# Patient Record
Sex: Male | Born: 1955
Health system: Southern US, Community
[De-identification: ages and names within clinical notes are randomized; demographics above are authoritative.]

## PROBLEM LIST (undated history)

## (undated) DIAGNOSIS — M545 Low back pain, unspecified: Secondary | ICD-10-CM

## (undated) DIAGNOSIS — J948 Other specified pleural conditions: Secondary | ICD-10-CM

## (undated) DIAGNOSIS — S2242XA Multiple fractures of ribs, left side, initial encounter for closed fracture: Secondary | ICD-10-CM

## (undated) DIAGNOSIS — B1920 Unspecified viral hepatitis C without hepatic coma: Secondary | ICD-10-CM

## (undated) DIAGNOSIS — E782 Mixed hyperlipidemia: Secondary | ICD-10-CM

## (undated) DIAGNOSIS — E039 Hypothyroidism, unspecified: Secondary | ICD-10-CM

## (undated) DIAGNOSIS — I10 Essential (primary) hypertension: Secondary | ICD-10-CM

## (undated) HISTORY — DX: Unspecified viral hepatitis C without hepatic coma: B19.20

## (undated) HISTORY — DX: Low back pain, unspecified: M54.50

## (undated) HISTORY — DX: Essential (primary) hypertension: I10

## (undated) HISTORY — DX: Other specified pleural conditions: J94.8

## (undated) HISTORY — DX: Hypothyroidism, unspecified: E03.9

## (undated) HISTORY — DX: Low back pain: M54.5

## (undated) HISTORY — PX: INGUINAL HERNIA REPAIR: SUR1180

## (undated) HISTORY — DX: Mixed hyperlipidemia: E78.2

## (undated) HISTORY — DX: Multiple fractures of ribs, left side, initial encounter for closed fracture: S22.42XA

---

## 2009-01-27 HISTORY — PX: LOBECTOMY: SHX5089

## 2009-01-27 HISTORY — PX: THORACOTOMY: SUR1349

## 2009-11-23 ENCOUNTER — Inpatient Hospital Stay (HOSPITAL_COMMUNITY)
Admission: AC | Admit: 2009-11-23 | Discharge: 2009-12-11 | Payer: Self-pay | Source: Home / Self Care | Admitting: Emergency Medicine

## 2009-11-24 DIAGNOSIS — J948 Other specified pleural conditions: Secondary | ICD-10-CM

## 2009-11-24 DIAGNOSIS — S2242XA Multiple fractures of ribs, left side, initial encounter for closed fracture: Secondary | ICD-10-CM

## 2009-11-24 HISTORY — PX: FRACTURE SURGERY: SHX138

## 2009-11-24 HISTORY — DX: Multiple fractures of ribs, left side, initial encounter for closed fracture: S22.42XA

## 2009-11-24 HISTORY — DX: Other specified pleural conditions: J94.8

## 2009-11-26 ENCOUNTER — Ambulatory Visit: Payer: Self-pay | Admitting: Thoracic Surgery

## 2009-11-28 ENCOUNTER — Ambulatory Visit: Payer: Self-pay | Admitting: Dentistry

## 2009-12-12 ENCOUNTER — Encounter: Payer: Self-pay | Admitting: Emergency Medicine

## 2009-12-13 ENCOUNTER — Observation Stay (HOSPITAL_COMMUNITY): Admission: EM | Admit: 2009-12-13 | Discharge: 2009-12-13 | Payer: Self-pay | Admitting: Emergency Medicine

## 2009-12-16 ENCOUNTER — Emergency Department (HOSPITAL_COMMUNITY): Admission: EM | Admit: 2009-12-16 | Discharge: 2009-12-17 | Payer: Self-pay | Admitting: Emergency Medicine

## 2009-12-21 ENCOUNTER — Emergency Department (HOSPITAL_COMMUNITY): Admission: EM | Admit: 2009-12-21 | Discharge: 2009-12-21 | Payer: Self-pay | Admitting: Emergency Medicine

## 2009-12-26 ENCOUNTER — Ambulatory Visit: Payer: Self-pay | Admitting: Thoracic Surgery

## 2009-12-26 ENCOUNTER — Encounter: Admission: RE | Admit: 2009-12-26 | Discharge: 2009-12-26 | Payer: Self-pay | Admitting: Thoracic Surgery

## 2010-02-06 ENCOUNTER — Ambulatory Visit
Admission: RE | Admit: 2010-02-06 | Discharge: 2010-02-06 | Payer: Self-pay | Source: Home / Self Care | Attending: Thoracic Surgery | Admitting: Thoracic Surgery

## 2010-02-06 ENCOUNTER — Encounter
Admission: RE | Admit: 2010-02-06 | Discharge: 2010-02-06 | Payer: Self-pay | Source: Home / Self Care | Attending: Thoracic Surgery | Admitting: Thoracic Surgery
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
## 2010-04-01 ENCOUNTER — Other Ambulatory Visit: Payer: Self-pay | Admitting: Thoracic Surgery

## 2010-04-01 DIAGNOSIS — S2249XA Multiple fractures of ribs, unspecified side, initial encounter for closed fracture: Secondary | ICD-10-CM

## 2010-04-02 ENCOUNTER — Ambulatory Visit
Admission: RE | Admit: 2010-04-02 | Discharge: 2010-04-02 | Disposition: A | Discharge status: Home / Self Care | Payer: Self-pay | Source: Ambulatory Visit | Attending: Thoracic Surgery | Admitting: Thoracic Surgery

## 2010-04-02 ENCOUNTER — Ambulatory Visit (INDEPENDENT_AMBULATORY_CARE_PROVIDER_SITE_OTHER): Payer: Self-pay | Admitting: Thoracic Surgery

## 2010-04-02 DIAGNOSIS — S2249XA Multiple fractures of ribs, unspecified side, initial encounter for closed fracture: Secondary | ICD-10-CM
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
## 2010-04-09 LAB — COMPREHENSIVE METABOLIC PANEL WITH GFR
ALT: 22 U/L (ref 0–53)
AST: 27 U/L (ref 0–37)
Albumin: 2.1 g/dL — ABNORMAL LOW (ref 3.5–5.2)
Alkaline Phosphatase: 36 U/L — ABNORMAL LOW (ref 39–117)
BUN: 17 mg/dL (ref 6–23)
CO2: 26 meq/L (ref 19–32)
Calcium: 7.6 mg/dL — ABNORMAL LOW (ref 8.4–10.5)
Chloride: 103 meq/L (ref 96–112)
Creatinine, Ser: 0.87 mg/dL (ref 0.4–1.5)
GFR calc non Af Amer: >60 mL/min
Glucose, Bld: 107 mg/dL — ABNORMAL HIGH (ref 70–99)
Potassium: 3.9 meq/L (ref 3.5–5.1)
Sodium: 132 meq/L — ABNORMAL LOW (ref 135–145)
Total Bilirubin: 0.9 mg/dL (ref 0.3–1.2)
Total Protein: 4.9 g/dL — ABNORMAL LOW (ref 6.0–8.3)

## 2010-04-09 LAB — CROSSMATCH: Unit division: 0

## 2010-04-09 LAB — BASIC METABOLIC PANEL
BUN: 11 mg/dL (ref 6–23)
BUN: 12 mg/dL (ref 6–23)
BUN: 13 mg/dL (ref 6–23)
BUN: 15 mg/dL (ref 6–23)
BUN: 16 mg/dL (ref 6–23)
BUN: 17 mg/dL (ref 6–23)
CO2: 25 mEq/L (ref 19–32)
CO2: 26 mEq/L (ref 19–32)
CO2: 26 mEq/L (ref 19–32)
CO2: 28 mEq/L (ref 19–32)
CO2: 29 mEq/L (ref 19–32)
Calcium: 8.4 mg/dL (ref 8.4–10.5)
Calcium: 9.2 mg/dL (ref 8.4–10.5)
Chloride: 104 mEq/L (ref 96–112)
Chloride: 105 mEq/L (ref 96–112)
Chloride: 106 mEq/L (ref 96–112)
Chloride: 108 mEq/L (ref 96–112)
Creatinine, Ser: 0.75 mg/dL (ref 0.4–1.5)
Creatinine, Ser: 0.77 mg/dL (ref 0.4–1.5)
Creatinine, Ser: 0.8 mg/dL (ref 0.4–1.5)
Creatinine, Ser: 0.84 mg/dL (ref 0.4–1.5)
Creatinine, Ser: 0.95 mg/dL (ref 0.4–1.5)
Creatinine, Ser: 0.97 mg/dL (ref 0.4–1.5)
Creatinine, Ser: 1.1 mg/dL (ref 0.4–1.5)
GFR calc Af Amer: >60 mL/min (ref >60)
GFR calc Af Amer: >60 mL/min (ref >60)
GFR calc Af Amer: >60 mL/min (ref >60)
GFR calc non Af Amer: >60 mL/min (ref >60)
GFR calc non Af Amer: >60 mL/min (ref >60)
GFR calc non Af Amer: >60 mL/min (ref >60)
GFR calc non Af Amer: >60 mL/min (ref >60)
Glucose, Bld: 100 mg/dL — ABNORMAL HIGH (ref 70–99)
Glucose, Bld: 108 mg/dL — ABNORMAL HIGH (ref 70–99)
Glucose, Bld: 109 mg/dL — ABNORMAL HIGH (ref 70–99)
Glucose, Bld: 112 mg/dL — ABNORMAL HIGH (ref 70–99)
Glucose, Bld: 86 mg/dL (ref 70–99)
Potassium: 4.5 mEq/L (ref 3.5–5.1)
Sodium: 137 mEq/L (ref 135–145)
Sodium: 137 mEq/L (ref 135–145)

## 2010-04-09 LAB — COMPREHENSIVE METABOLIC PANEL
ALT: 40 U/L (ref 0–53)
AST: 44 U/L — ABNORMAL HIGH (ref 0–37)
Albumin: 2.3 g/dL — ABNORMAL LOW (ref 3.5–5.2)
Alkaline Phosphatase: 131 U/L — ABNORMAL HIGH (ref 39–117)
BUN: 10 mg/dL (ref 6–23)
CO2: 24 mEq/L (ref 19–32)
CO2: 25 mEq/L (ref 19–32)
Calcium: 8 mg/dL — ABNORMAL LOW (ref 8.4–10.5)
Chloride: 110 mEq/L (ref 96–112)
Creatinine, Ser: 0.82 mg/dL (ref 0.4–1.5)
GFR calc Af Amer: >60 mL/min (ref >60)
GFR calc non Af Amer: >60 mL/min (ref >60)
GFR calc non Af Amer: >60 mL/min (ref >60)
Glucose, Bld: 119 mg/dL — ABNORMAL HIGH (ref 70–99)
Potassium: 3.9 mEq/L (ref 3.5–5.1)
Sodium: 137 mEq/L (ref 135–145)
Total Bilirubin: 0.7 mg/dL (ref 0.3–1.2)

## 2010-04-09 LAB — CBC
HCT: 24.6 % — ABNORMAL LOW (ref 39.0–52.0)
HCT: 27 % — ABNORMAL LOW (ref 39.0–52.0)
HCT: 27.9 % — ABNORMAL LOW (ref 39.0–52.0)
HCT: 29.3 % — ABNORMAL LOW (ref 39.0–52.0)
HCT: 31 % — ABNORMAL LOW (ref 39.0–52.0)
HCT: 32.2 % — ABNORMAL LOW (ref 39.0–52.0)
Hemoglobin: 10.1 g/dL — ABNORMAL LOW (ref 13.0–17.0)
Hemoglobin: 10.6 g/dL — ABNORMAL LOW (ref 13.0–17.0)
Hemoglobin: 10.8 g/dL — ABNORMAL LOW (ref 13.0–17.0)
Hemoglobin: 8.5 g/dL — ABNORMAL LOW (ref 13.0–17.0)
Hemoglobin: 8.6 g/dL — ABNORMAL LOW (ref 13.0–17.0)
Hemoglobin: 9.3 g/dL — ABNORMAL LOW (ref 13.0–17.0)
MCH: 29.5 pg (ref 26.0–34.0)
MCH: 29.8 pg (ref 26.0–34.0)
MCH: 30.2 pg (ref 26.0–34.0)
MCH: 30.4 pg (ref 26.0–34.0)
MCH: 30.5 pg (ref 26.0–34.0)
MCH: 30.5 pg (ref 26.0–34.0)
MCH: 30.6 pg (ref 26.0–34.0)
MCHC: 33.3 g/dL (ref 30.0–36.0)
MCHC: 33.7 g/dL (ref 30.0–36.0)
MCHC: 34.1 g/dL (ref 30.0–36.0)
MCHC: 34.2 g/dL (ref 30.0–36.0)
MCHC: 34.5 g/dL (ref 30.0–36.0)
MCHC: 34.5 g/dL (ref 30.0–36.0)
MCHC: 34.7 g/dL (ref 30.0–36.0)
MCHC: 35 g/dL (ref 30.0–36.0)
MCV: 86.4 fL (ref 78.0–100.0)
MCV: 87.4 fL (ref 78.0–100.0)
MCV: 87.4 fL (ref 78.0–100.0)
MCV: 87.7 fL (ref 78.0–100.0)
MCV: 88.6 fL (ref 78.0–100.0)
MCV: 89.1 fL (ref 78.0–100.0)
MCV: 89.3 fL (ref 78.0–100.0)
MCV: 89.3 fL (ref 78.0–100.0)
Platelets: 185 10*3/uL (ref 150–400)
Platelets: 212 10*3/uL (ref 150–400)
Platelets: 218 10*3/uL (ref 150–400)
Platelets: 225 10*3/uL (ref 150–400)
Platelets: 229 10*3/uL (ref 150–400)
Platelets: 285 10*3/uL (ref 150–400)
Platelets: 353 10*3/uL (ref 150–400)
Platelets: 511 10*3/uL — ABNORMAL HIGH (ref 150–400)
RBC: 2.84 MIL/uL — ABNORMAL LOW (ref 4.22–5.81)
RBC: 3.15 MIL/uL — ABNORMAL LOW (ref 4.22–5.81)
RBC: 3.34 MIL/uL — ABNORMAL LOW (ref 4.22–5.81)
RBC: 3.48 MIL/uL — ABNORMAL LOW (ref 4.22–5.81)
RBC: 3.49 MIL/uL — ABNORMAL LOW (ref 4.22–5.81)
RBC: 3.55 MIL/uL — ABNORMAL LOW (ref 4.22–5.81)
RDW: 12.8 % (ref 11.5–15.5)
RDW: 12.8 % (ref 11.5–15.5)
RDW: 12.8 % (ref 11.5–15.5)
RDW: 12.8 % (ref 11.5–15.5)
RDW: 12.8 % (ref 11.5–15.5)
RDW: 13.3 % (ref 11.5–15.5)
RDW: 14.1 % (ref 11.5–15.5)
WBC: 6.2 10*3/uL (ref 4.0–10.5)
WBC: 6.7 10*3/uL (ref 4.0–10.5)
WBC: 6.9 10*3/uL (ref 4.0–10.5)
WBC: 7.4 10*3/uL (ref 4.0–10.5)
WBC: 7.8 10*3/uL (ref 4.0–10.5)
WBC: 9 10*3/uL (ref 4.0–10.5)
WBC: 8.9 10*3/uL (ref 4.0–10.5)

## 2010-04-09 LAB — URINE CULTURE
Culture  Setup Time: 201111051823
Culture: NO GROWTH

## 2010-04-09 LAB — RPR: RPR Ser Ql: NONREACTIVE

## 2010-04-09 LAB — BASIC METABOLIC PANEL WITH GFR
BUN: 12 mg/dL (ref 6–23)
CO2: 30 meq/L (ref 19–32)
Calcium: 8 mg/dL — ABNORMAL LOW (ref 8.4–10.5)
Chloride: 101 meq/L (ref 96–112)
Creatinine, Ser: 0.84 mg/dL (ref 0.4–1.5)
GFR calc non Af Amer: >60 mL/min
Glucose, Bld: 130 mg/dL — ABNORMAL HIGH (ref 70–99)
Potassium: 4.3 meq/L (ref 3.5–5.1)
Sodium: 135 meq/L (ref 135–145)

## 2010-04-09 LAB — URINALYSIS, ROUTINE W REFLEX MICROSCOPIC
Bilirubin Urine: NEGATIVE
Glucose, UA: NEGATIVE mg/dL
Glucose, UA: NEGATIVE mg/dL
Hgb urine dipstick: NEGATIVE
Ketones, ur: 15 mg/dL — AB
Ketones, ur: NEGATIVE mg/dL
Nitrite: NEGATIVE
Protein, ur: 30 mg/dL — AB
Protein, ur: NEGATIVE mg/dL
Specific Gravity, Urine: 1.015 (ref 1.005–1.030)
Urobilinogen, UA: 0.2 mg/dL (ref 0.0–1.0)
Urobilinogen, UA: 2 mg/dL — ABNORMAL HIGH (ref 0.0–1.0)
pH: 6 (ref 5.0–8.0)

## 2010-04-09 LAB — DIFFERENTIAL
Basophils Absolute: 0.1 10*3/uL (ref 0.0–0.1)
Basophils Relative: 1 % (ref 0–1)
Basophils Relative: 1 % (ref 0–1)
Eosinophils Absolute: 0.5 10*3/uL (ref 0.0–0.7)
Eosinophils Relative: 8 % — ABNORMAL HIGH (ref 0–5)
Lymphocytes Relative: 22 % (ref 12–46)
Lymphs Abs: 1.2 10*3/uL (ref 0.7–4.0)
Monocytes Absolute: 0.7 10*3/uL (ref 0.1–1.0)
Monocytes Relative: 8 % (ref 3–12)
Neutro Abs: 5.8 10*3/uL (ref 1.7–7.7)
Neutrophils Relative %: 66 % (ref 43–77)

## 2010-04-09 LAB — BLOOD GAS, ARTERIAL
Bicarbonate: 27.8 mEq/L — ABNORMAL HIGH (ref 20.0–24.0)
O2 Saturation: 96 %
Patient temperature: 98.6
TCO2: 28.9 mmol/L (ref 0–100)
pH, Arterial: 7.476 — ABNORMAL HIGH (ref 7.350–7.450)

## 2010-04-09 LAB — URINE MICROSCOPIC-ADD ON

## 2010-04-09 LAB — POCT CARDIAC MARKERS: Troponin i, poc: <0.05 ng/mL (ref 0.00–0.09)

## 2010-04-09 LAB — RAPID URINE DRUG SCREEN, HOSP PERFORMED
Amphetamines: NOT DETECTED
Barbiturates: NOT DETECTED
Barbiturates: NOT DETECTED
Benzodiazepines: POSITIVE — AB
Benzodiazepines: POSITIVE — AB
Cocaine: NOT DETECTED
Cocaine: NOT DETECTED
Opiates: POSITIVE — AB
Tetrahydrocannabinol: NOT DETECTED

## 2010-04-09 LAB — TSH: TSH: 3.991 u[IU]/mL (ref 0.350–4.500)

## 2010-04-09 LAB — PROTIME-INR
INR: 1.02 (ref 0.00–1.49)
Prothrombin Time: 13.6 seconds (ref 11.6–15.2)

## 2010-04-10 LAB — CBC
HCT: 34.1 % — ABNORMAL LOW (ref 39.0–52.0)
HCT: 36.3 % — ABNORMAL LOW (ref 39.0–52.0)
HCT: 39.4 % (ref 39.0–52.0)
Hemoglobin: 10 g/dL — ABNORMAL LOW (ref 13.0–17.0)
Hemoglobin: 13.5 g/dL (ref 13.0–17.0)
MCH: 29.8 pg (ref 26.0–34.0)
MCH: 29.9 pg (ref 26.0–34.0)
MCH: 30.1 pg (ref 26.0–34.0)
MCHC: 33.4 g/dL (ref 30.0–36.0)
MCHC: 33.7 g/dL (ref 30.0–36.0)
MCHC: 34.3 g/dL (ref 30.0–36.0)
MCV: 88.3 fL (ref 78.0–100.0)
Platelets: 151 10*3/uL (ref 150–400)
Platelets: 189 10*3/uL (ref 150–400)
RBC: 4.55 MIL/uL (ref 4.22–5.81)
RDW: 12.5 % (ref 11.5–15.5)
RDW: 12.5 % (ref 11.5–15.5)
RDW: 12.9 % (ref 11.5–15.5)
WBC: 13.2 10*3/uL — ABNORMAL HIGH (ref 4.0–10.5)

## 2010-04-10 LAB — TYPE AND SCREEN

## 2010-04-10 LAB — COMPREHENSIVE METABOLIC PANEL
ALT: 69 U/L — ABNORMAL HIGH (ref 0–53)
Alkaline Phosphatase: 65 U/L (ref 39–117)
CO2: 21 mEq/L (ref 19–32)
Chloride: 107 mEq/L (ref 96–112)
GFR calc non Af Amer: 58 mL/min — ABNORMAL LOW (ref >60)
Glucose, Bld: 99 mg/dL (ref 70–99)
Potassium: 4 mEq/L (ref 3.5–5.1)
Sodium: 136 mEq/L (ref 135–145)
Total Bilirubin: 1 mg/dL (ref 0.3–1.2)

## 2010-04-10 LAB — BASIC METABOLIC PANEL
BUN: 12 mg/dL (ref 6–23)
BUN: 29 mg/dL — ABNORMAL HIGH (ref 6–23)
CO2: 23 mEq/L (ref 19–32)
CO2: 24 mEq/L (ref 19–32)
CO2: 28 mEq/L (ref 19–32)
Calcium: 8.3 mg/dL — ABNORMAL LOW (ref 8.4–10.5)
Calcium: 8.7 mg/dL (ref 8.4–10.5)
Chloride: 107 mEq/L (ref 96–112)
Creatinine, Ser: 0.78 mg/dL (ref 0.4–1.5)
Creatinine, Ser: 0.83 mg/dL (ref 0.4–1.5)
Creatinine, Ser: 1.15 mg/dL (ref 0.4–1.5)
GFR calc non Af Amer: >60 mL/min (ref >60)
GFR calc non Af Amer: >60 mL/min (ref >60)
Glucose, Bld: 104 mg/dL — ABNORMAL HIGH (ref 70–99)
Glucose, Bld: 113 mg/dL — ABNORMAL HIGH (ref 70–99)
Potassium: 4.7 mEq/L (ref 3.5–5.1)
Sodium: 137 mEq/L (ref 135–145)
Sodium: 139 mEq/L (ref 135–145)

## 2010-04-10 LAB — PROTIME-INR: Prothrombin Time: 14.1 seconds (ref 11.6–15.2)

## 2010-04-10 LAB — POCT I-STAT, CHEM 8
Chloride: 109 mEq/L (ref 96–112)
Glucose, Bld: 100 mg/dL — ABNORMAL HIGH (ref 70–99)
HCT: 43 % (ref 39.0–52.0)
Potassium: 4.2 mEq/L (ref 3.5–5.1)

## 2010-05-23 DIAGNOSIS — Z0271 Encounter for disability determination: Secondary | ICD-10-CM

## 2010-05-31 ENCOUNTER — Other Ambulatory Visit: Payer: Self-pay | Admitting: Thoracic Surgery

## 2010-06-04 ENCOUNTER — Ambulatory Visit
Admission: RE | Admit: 2010-06-04 | Discharge: 2010-06-04 | Disposition: A | Discharge status: Home / Self Care | Payer: Self-pay | Source: Ambulatory Visit | Attending: Thoracic Surgery | Admitting: Thoracic Surgery

## 2010-06-04 ENCOUNTER — Ambulatory Visit (INDEPENDENT_AMBULATORY_CARE_PROVIDER_SITE_OTHER): Payer: Self-pay | Admitting: Thoracic Surgery

## 2010-06-04 DIAGNOSIS — S2249XA Multiple fractures of ribs, unspecified side, initial encounter for closed fracture: Secondary | ICD-10-CM

## 2010-06-05 NOTE — Assessment & Plan Note (Signed)
OFFICE VISIT  Colton Gibbs, Colton Gibbs DOB:  1955-12-18                                        Jun 04, 2010 CHART #:  [id number]  The patient is referred from [facility] Room with questionable loose plate.  We got his chest x-ray here and compared it to the areas in the second plate of the fourth rib does look like it is a little loose laterally, but I think it is still stable.  The rib has healed completely and he has lost 2 screws into the soft tissues and I explained that to him.  His main pain is left shoulder is probably related and it is not related to subscapular pain, but is related more to rotator cuff and probably biceps tendon.  His blood pressure was 118/70, pulse 78, respirations 20, and sats were 99%.  Ines Bloomer, M.D. Electronically Signed  DPB/MEDQ  D:  06/04/2010  T:  06/05/2010  Job:  [id number]

## 2010-06-11 NOTE — Assessment & Plan Note (Signed)
OFFICE VISIT   Colton Gibbs, Colton Gibbs  DOB:  13-Nov-1955                                        February 06, 2010  CHART #:  [id number]   The patient returns after his rib plating.  He still has some mild  tenderness in his left chest.  We gave him a refill for Vicodin 5/500,  #60 with no refills.  His chest x-ray looks like rib fractures are  healing well.  I plan to see him back again in 8 weeks with another  chest x-ray for final check.  His blood pressure was 124/87, pulse 69,  respirations 18, saturations were 96%.   Ines Bloomer, M.D.  Electronically Signed   DPB/MEDQ  D:  02/06/2010  T:  02/06/2010  Job:  [id number]
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
## 2010-11-29 ENCOUNTER — Other Ambulatory Visit: Payer: Self-pay | Admitting: Thoracic Surgery

## 2010-11-29 DIAGNOSIS — S2249XA Multiple fractures of ribs, unspecified side, initial encounter for closed fracture: Secondary | ICD-10-CM

## 2010-12-03 ENCOUNTER — Encounter: Payer: Self-pay | Admitting: *Deleted

## 2010-12-03 DIAGNOSIS — J948 Other specified pleural conditions: Secondary | ICD-10-CM | POA: Insufficient documentation

## 2010-12-03 DIAGNOSIS — S2242XA Multiple fractures of ribs, left side, initial encounter for closed fracture: Secondary | ICD-10-CM | POA: Insufficient documentation

## 2010-12-04 ENCOUNTER — Encounter: Payer: Self-pay | Admitting: Thoracic Surgery

## 2010-12-04 ENCOUNTER — Ambulatory Visit
Admission: RE | Admit: 2010-12-04 | Discharge: 2010-12-04 | Disposition: A | Discharge status: Home / Self Care | Payer: Medicaid Other | Source: Ambulatory Visit | Attending: Thoracic Surgery | Admitting: Thoracic Surgery

## 2010-12-04 ENCOUNTER — Ambulatory Visit (INDEPENDENT_AMBULATORY_CARE_PROVIDER_SITE_OTHER): Payer: Medicaid Other | Admitting: Thoracic Surgery

## 2010-12-04 VITALS — BP 103/70 | HR 100 | Resp 20 | Ht 77.0 in | Wt 215.0 lb

## 2010-12-04 DIAGNOSIS — S2249XA Multiple fractures of ribs, unspecified side, initial encounter for closed fracture: Secondary | ICD-10-CM
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
## 2011-02-19 DIAGNOSIS — Z0271 Encounter for disability determination: Secondary | ICD-10-CM

## 2011-05-30 ENCOUNTER — Other Ambulatory Visit: Payer: Self-pay | Admitting: Thoracic Surgery

## 2011-05-30 DIAGNOSIS — S2249XA Multiple fractures of ribs, unspecified side, initial encounter for closed fracture: Secondary | ICD-10-CM

## 2011-06-04 ENCOUNTER — Ambulatory Visit: Payer: Medicaid Other | Admitting: Thoracic Surgery

## 2011-06-04 ENCOUNTER — Ambulatory Visit
Admission: RE | Admit: 2011-06-04 | Discharge: 2011-06-04 | Disposition: A | Discharge status: Home / Self Care | Payer: Medicaid Other | Source: Ambulatory Visit | Attending: Thoracic Surgery | Admitting: Thoracic Surgery

## 2011-06-04 ENCOUNTER — Ambulatory Visit (INDEPENDENT_AMBULATORY_CARE_PROVIDER_SITE_OTHER): Payer: Medicaid Other | Admitting: Thoracic Surgery

## 2011-06-04 VITALS — Ht 77.0 in | Wt 215.0 lb

## 2011-06-04 DIAGNOSIS — S2249XA Multiple fractures of ribs, unspecified side, initial encounter for closed fracture: Secondary | ICD-10-CM

## 2011-06-04 DIAGNOSIS — Z9889 Other specified postprocedural states: Secondary | ICD-10-CM

## 2011-06-05 NOTE — Progress Notes (Signed)
Patient ID: Colton Gibbs, male   DOB: 02-25-1955, 56 y.o.   MRN: [id number] The patient had a chest x-ray which was stable but did not return for followup. We will try to reschedule.

## 2011-07-16 ENCOUNTER — Encounter: Payer: Self-pay | Admitting: Thoracic Surgery

## 2011-07-16 ENCOUNTER — Ambulatory Visit (INDEPENDENT_AMBULATORY_CARE_PROVIDER_SITE_OTHER): Payer: Medicaid Other | Admitting: Thoracic Surgery

## 2011-07-16 VITALS — BP 112/70 | HR 83 | Resp 18 | Ht 77.0 in | Wt 215.0 lb

## 2011-07-16 DIAGNOSIS — S2249XA Multiple fractures of ribs, unspecified side, initial encounter for closed fracture: Secondary | ICD-10-CM

## 2011-07-16 DIAGNOSIS — Z09 Encounter for follow-up examination after completed treatment for conditions other than malignant neoplasm: Secondary | ICD-10-CM
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
## 2011-08-08 DIAGNOSIS — Z0279 Encounter for issue of other medical certificate: Secondary | ICD-10-CM
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
## 2011-11-20 IMAGING — CR DG CHEST 1V PORT
1 series · 1 of 1 positions shown · non-contrast
Comparison: Yesterday

CLINICAL DATA: Pneumothorax

PORTABLE CHEST - 1 VIEW

[view not recorded]
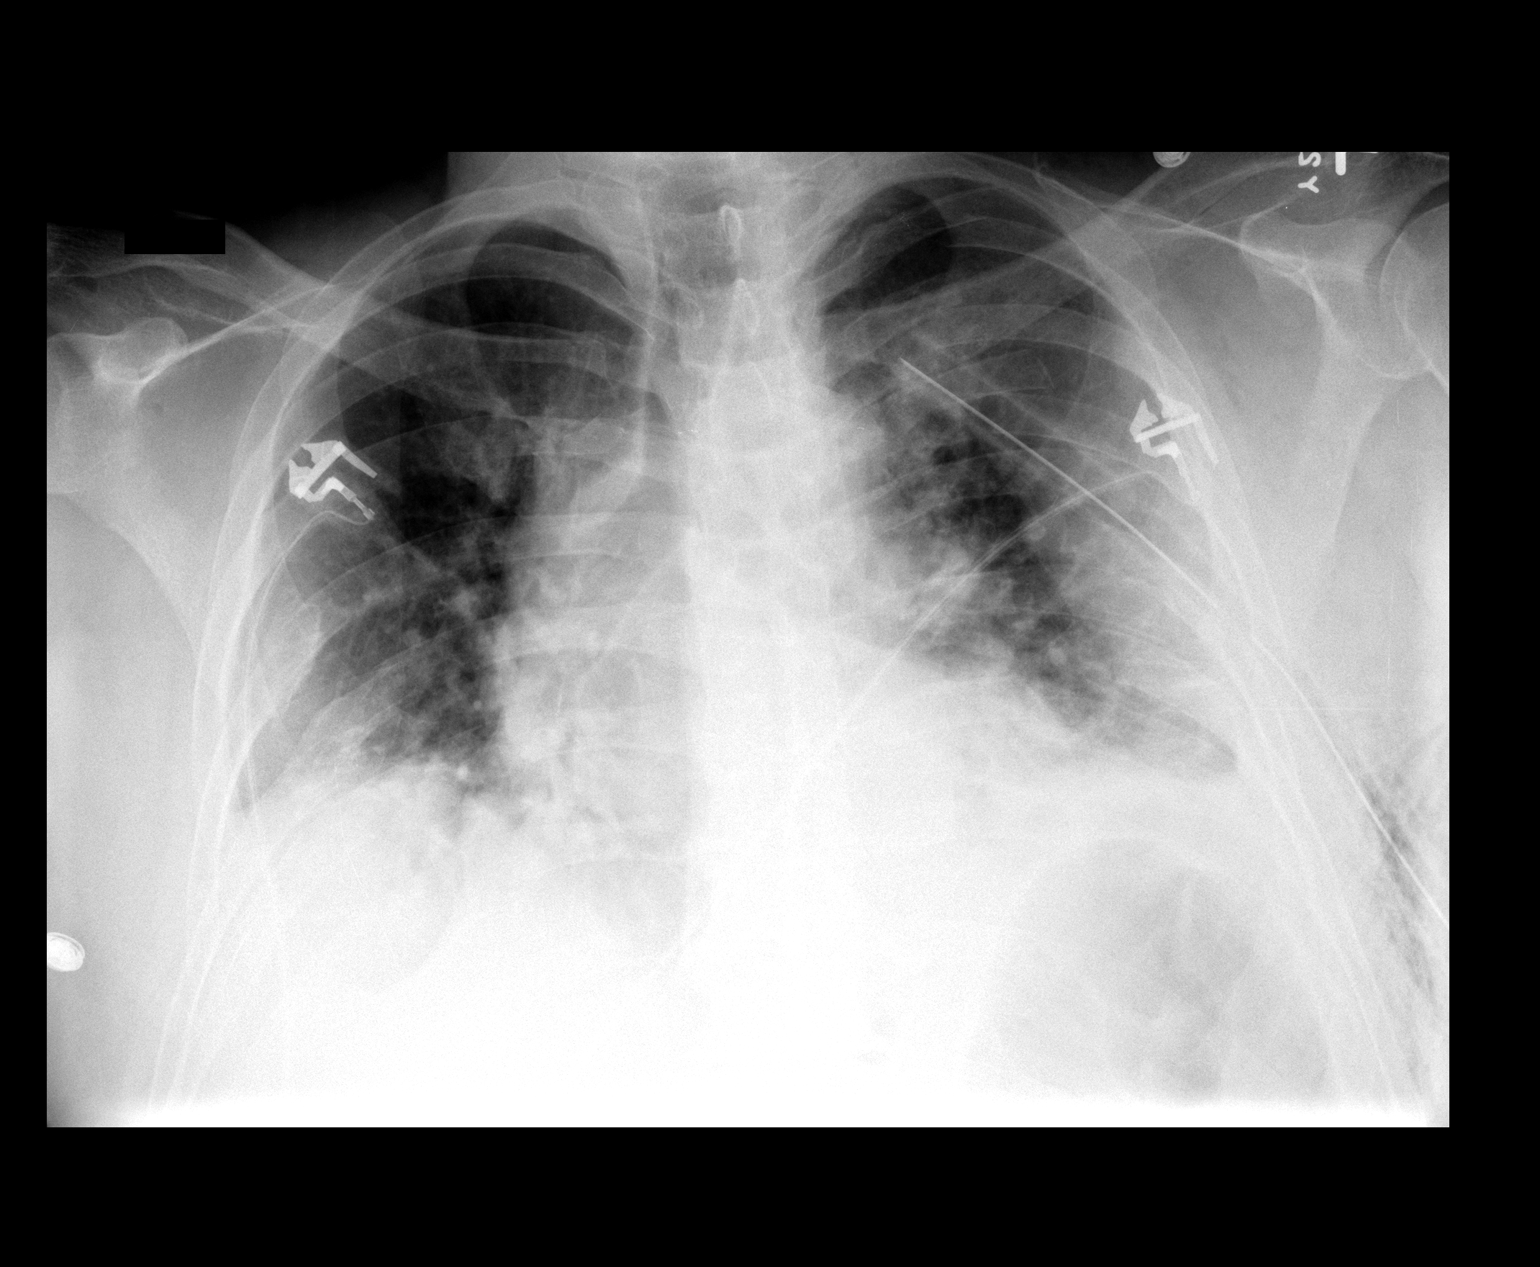

[1 of 1 positions shown; findings below may reference images not displayed]

FINDINGS: Left chest tube remains in place.  No pneumothorax.  Very
low volumes.  Central basilar atelectasis.  Heart is prominent
secondary low volumes.  Left pleural effusion is suspect.  Left rib
fractures are redemonstrated with deformity of thoracic cage.
IMPRESSION: Worsening basilar atelectasis.

Stable chest tube without pneumothorax.
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
## 2013-01-27 HISTORY — PX: COLONOSCOPY: SHX174

## 2014-09-06 ENCOUNTER — Other Ambulatory Visit (HOSPITAL_COMMUNITY): Payer: Self-pay | Admitting: Internal Medicine

## 2014-09-06 DIAGNOSIS — B1920 Unspecified viral hepatitis C without hepatic coma: Secondary | ICD-10-CM

## 2014-09-13 ENCOUNTER — Other Ambulatory Visit (HOSPITAL_COMMUNITY): Payer: Medicaid Other

## 2014-09-14 ENCOUNTER — Ambulatory Visit (HOSPITAL_COMMUNITY)
Admission: RE | Admit: 2014-09-14 | Discharge: 2014-09-14 | Disposition: A | Discharge status: Home / Self Care | Payer: Medicare Other | Source: Ambulatory Visit | Attending: Internal Medicine | Admitting: Internal Medicine

## 2014-09-14 DIAGNOSIS — B1920 Unspecified viral hepatitis C without hepatic coma: Secondary | ICD-10-CM

## 2014-12-01 DIAGNOSIS — J200 Acute bronchitis due to Mycoplasma pneumoniae: Secondary | ICD-10-CM | POA: Diagnosis not present

## 2014-12-27 DIAGNOSIS — B1920 Unspecified viral hepatitis C without hepatic coma: Secondary | ICD-10-CM | POA: Diagnosis not present

## 2015-01-03 DIAGNOSIS — M791 Myalgia: Secondary | ICD-10-CM | POA: Diagnosis not present

## 2015-01-03 DIAGNOSIS — M7502 Adhesive capsulitis of left shoulder: Secondary | ICD-10-CM | POA: Diagnosis not present

## 2015-01-03 DIAGNOSIS — R0789 Other chest pain: Secondary | ICD-10-CM | POA: Diagnosis not present

## 2015-01-03 DIAGNOSIS — M47816 Spondylosis without myelopathy or radiculopathy, lumbar region: Secondary | ICD-10-CM | POA: Diagnosis not present

## 2015-01-24 DIAGNOSIS — B1920 Unspecified viral hepatitis C without hepatic coma: Secondary | ICD-10-CM | POA: Diagnosis not present

## 2015-02-06 DIAGNOSIS — B1920 Unspecified viral hepatitis C without hepatic coma: Secondary | ICD-10-CM | POA: Diagnosis not present

## 2015-02-13 DIAGNOSIS — B1920 Unspecified viral hepatitis C without hepatic coma: Secondary | ICD-10-CM | POA: Diagnosis not present

## 2015-03-05 DIAGNOSIS — B182 Chronic viral hepatitis C: Secondary | ICD-10-CM | POA: Diagnosis not present

## 2015-03-05 DIAGNOSIS — B1920 Unspecified viral hepatitis C without hepatic coma: Secondary | ICD-10-CM | POA: Diagnosis not present

## 2015-03-28 DIAGNOSIS — B1920 Unspecified viral hepatitis C without hepatic coma: Secondary | ICD-10-CM | POA: Diagnosis not present

## 2015-04-10 DIAGNOSIS — E782 Mixed hyperlipidemia: Secondary | ICD-10-CM | POA: Diagnosis not present

## 2015-04-10 DIAGNOSIS — E039 Hypothyroidism, unspecified: Secondary | ICD-10-CM | POA: Diagnosis not present

## 2015-04-10 DIAGNOSIS — D649 Anemia, unspecified: Secondary | ICD-10-CM | POA: Diagnosis not present

## 2015-04-10 DIAGNOSIS — R5383 Other fatigue: Secondary | ICD-10-CM | POA: Diagnosis not present

## 2015-04-10 DIAGNOSIS — I10 Essential (primary) hypertension: Secondary | ICD-10-CM | POA: Diagnosis not present

## 2015-04-13 DIAGNOSIS — Z1389 Encounter for screening for other disorder: Secondary | ICD-10-CM | POA: Diagnosis not present

## 2015-04-13 DIAGNOSIS — E782 Mixed hyperlipidemia: Secondary | ICD-10-CM | POA: Diagnosis not present

## 2015-04-13 DIAGNOSIS — Z0001 Encounter for general adult medical examination with abnormal findings: Secondary | ICD-10-CM | POA: Diagnosis not present

## 2015-04-13 DIAGNOSIS — E039 Hypothyroidism, unspecified: Secondary | ICD-10-CM | POA: Diagnosis not present

## 2015-04-13 DIAGNOSIS — I10 Essential (primary) hypertension: Secondary | ICD-10-CM | POA: Diagnosis not present

## 2015-04-13 DIAGNOSIS — Z9189 Other specified personal risk factors, not elsewhere classified: Secondary | ICD-10-CM | POA: Diagnosis not present

## 2015-05-04 DIAGNOSIS — M7502 Adhesive capsulitis of left shoulder: Secondary | ICD-10-CM | POA: Diagnosis not present

## 2015-05-04 DIAGNOSIS — M47816 Spondylosis without myelopathy or radiculopathy, lumbar region: Secondary | ICD-10-CM | POA: Diagnosis not present

## 2015-05-04 DIAGNOSIS — R0789 Other chest pain: Secondary | ICD-10-CM | POA: Diagnosis not present

## 2015-05-04 DIAGNOSIS — Z8781 Personal history of (healed) traumatic fracture: Secondary | ICD-10-CM | POA: Diagnosis not present

## 2015-05-25 DIAGNOSIS — E039 Hypothyroidism, unspecified: Secondary | ICD-10-CM | POA: Diagnosis not present

## 2015-07-10 DIAGNOSIS — E039 Hypothyroidism, unspecified: Secondary | ICD-10-CM | POA: Diagnosis not present

## 2015-08-07 DIAGNOSIS — E039 Hypothyroidism, unspecified: Secondary | ICD-10-CM | POA: Diagnosis not present

## 2015-08-07 DIAGNOSIS — D649 Anemia, unspecified: Secondary | ICD-10-CM | POA: Diagnosis not present

## 2015-08-07 DIAGNOSIS — E782 Mixed hyperlipidemia: Secondary | ICD-10-CM | POA: Diagnosis not present

## 2015-08-07 DIAGNOSIS — R5383 Other fatigue: Secondary | ICD-10-CM | POA: Diagnosis not present

## 2015-08-07 DIAGNOSIS — I10 Essential (primary) hypertension: Secondary | ICD-10-CM | POA: Diagnosis not present

## 2015-08-09 DIAGNOSIS — E039 Hypothyroidism, unspecified: Secondary | ICD-10-CM | POA: Diagnosis not present

## 2015-08-09 DIAGNOSIS — M545 Low back pain: Secondary | ICD-10-CM | POA: Diagnosis not present

## 2015-08-09 DIAGNOSIS — E782 Mixed hyperlipidemia: Secondary | ICD-10-CM | POA: Diagnosis not present

## 2015-08-09 DIAGNOSIS — I10 Essential (primary) hypertension: Secondary | ICD-10-CM | POA: Diagnosis not present

## 2015-08-09 DIAGNOSIS — Z1212 Encounter for screening for malignant neoplasm of rectum: Secondary | ICD-10-CM | POA: Diagnosis not present

## 2015-08-14 DIAGNOSIS — K4090 Unilateral inguinal hernia, without obstruction or gangrene, not specified as recurrent: Secondary | ICD-10-CM | POA: Diagnosis not present

## 2015-08-15 DIAGNOSIS — Z79899 Other long term (current) drug therapy: Secondary | ICD-10-CM | POA: Diagnosis not present

## 2015-08-15 DIAGNOSIS — D176 Benign lipomatous neoplasm of spermatic cord: Secondary | ICD-10-CM | POA: Diagnosis not present

## 2015-08-15 DIAGNOSIS — E039 Hypothyroidism, unspecified: Secondary | ICD-10-CM | POA: Diagnosis not present

## 2015-08-15 DIAGNOSIS — I10 Essential (primary) hypertension: Secondary | ICD-10-CM | POA: Diagnosis not present

## 2015-08-15 DIAGNOSIS — E7800 Pure hypercholesterolemia, unspecified: Secondary | ICD-10-CM | POA: Diagnosis not present

## 2015-08-15 DIAGNOSIS — Z811 Family history of alcohol abuse and dependence: Secondary | ICD-10-CM | POA: Diagnosis not present

## 2015-08-15 DIAGNOSIS — D179 Benign lipomatous neoplasm, unspecified: Secondary | ICD-10-CM | POA: Diagnosis not present

## 2015-08-15 DIAGNOSIS — Z8619 Personal history of other infectious and parasitic diseases: Secondary | ICD-10-CM | POA: Diagnosis not present

## 2015-08-15 DIAGNOSIS — K4090 Unilateral inguinal hernia, without obstruction or gangrene, not specified as recurrent: Secondary | ICD-10-CM | POA: Diagnosis not present

## 2015-08-15 DIAGNOSIS — Z880 Allergy status to penicillin: Secondary | ICD-10-CM | POA: Diagnosis not present

## 2015-08-15 DIAGNOSIS — R339 Retention of urine, unspecified: Secondary | ICD-10-CM | POA: Diagnosis not present

## 2015-08-20 DIAGNOSIS — R339 Retention of urine, unspecified: Secondary | ICD-10-CM | POA: Diagnosis not present

## 2015-08-20 DIAGNOSIS — R3915 Urgency of urination: Secondary | ICD-10-CM | POA: Diagnosis not present

## 2015-08-20 DIAGNOSIS — R3912 Poor urinary stream: Secondary | ICD-10-CM | POA: Diagnosis not present

## 2015-09-13 DIAGNOSIS — B1920 Unspecified viral hepatitis C without hepatic coma: Secondary | ICD-10-CM | POA: Diagnosis not present

## 2015-09-24 DIAGNOSIS — G8929 Other chronic pain: Secondary | ICD-10-CM | POA: Diagnosis not present

## 2015-09-24 DIAGNOSIS — M47817 Spondylosis without myelopathy or radiculopathy, lumbosacral region: Secondary | ICD-10-CM | POA: Diagnosis not present

## 2015-09-24 DIAGNOSIS — Z79899 Other long term (current) drug therapy: Secondary | ICD-10-CM | POA: Diagnosis not present

## 2015-09-24 DIAGNOSIS — Z8781 Personal history of (healed) traumatic fracture: Secondary | ICD-10-CM | POA: Diagnosis not present

## 2015-09-24 DIAGNOSIS — R0789 Other chest pain: Secondary | ICD-10-CM | POA: Diagnosis not present

## 2015-09-25 DIAGNOSIS — R3912 Poor urinary stream: Secondary | ICD-10-CM | POA: Diagnosis not present

## 2015-09-25 DIAGNOSIS — R339 Retention of urine, unspecified: Secondary | ICD-10-CM | POA: Diagnosis not present

## 2015-09-25 DIAGNOSIS — R3915 Urgency of urination: Secondary | ICD-10-CM | POA: Diagnosis not present

## 2015-10-24 DIAGNOSIS — G8929 Other chronic pain: Secondary | ICD-10-CM | POA: Diagnosis not present

## 2015-10-24 DIAGNOSIS — Z79891 Long term (current) use of opiate analgesic: Secondary | ICD-10-CM | POA: Diagnosis not present

## 2015-10-24 DIAGNOSIS — M5136 Other intervertebral disc degeneration, lumbar region: Secondary | ICD-10-CM | POA: Diagnosis not present

## 2015-10-24 DIAGNOSIS — R0789 Other chest pain: Secondary | ICD-10-CM | POA: Diagnosis not present

## 2015-11-06 DIAGNOSIS — M5442 Lumbago with sciatica, left side: Secondary | ICD-10-CM | POA: Diagnosis not present

## 2015-11-06 DIAGNOSIS — G894 Chronic pain syndrome: Secondary | ICD-10-CM | POA: Diagnosis not present

## 2015-11-06 DIAGNOSIS — M5432 Sciatica, left side: Secondary | ICD-10-CM | POA: Diagnosis not present

## 2015-11-06 DIAGNOSIS — M545 Low back pain: Secondary | ICD-10-CM | POA: Diagnosis not present

## 2015-11-06 DIAGNOSIS — Z79891 Long term (current) use of opiate analgesic: Secondary | ICD-10-CM | POA: Diagnosis not present

## 2015-11-19 DIAGNOSIS — M545 Low back pain: Secondary | ICD-10-CM | POA: Diagnosis not present

## 2015-11-19 DIAGNOSIS — M79652 Pain in left thigh: Secondary | ICD-10-CM | POA: Diagnosis not present

## 2015-11-19 DIAGNOSIS — M25512 Pain in left shoulder: Secondary | ICD-10-CM | POA: Diagnosis not present

## 2015-11-19 DIAGNOSIS — G894 Chronic pain syndrome: Secondary | ICD-10-CM | POA: Diagnosis not present

## 2015-11-19 DIAGNOSIS — Z79891 Long term (current) use of opiate analgesic: Secondary | ICD-10-CM | POA: Diagnosis not present

## 2015-11-21 DIAGNOSIS — M9901 Segmental and somatic dysfunction of cervical region: Secondary | ICD-10-CM | POA: Diagnosis not present

## 2015-11-21 DIAGNOSIS — M9902 Segmental and somatic dysfunction of thoracic region: Secondary | ICD-10-CM | POA: Diagnosis not present

## 2015-11-21 DIAGNOSIS — S134XXA Sprain of ligaments of cervical spine, initial encounter: Secondary | ICD-10-CM | POA: Diagnosis not present

## 2015-11-21 DIAGNOSIS — M9903 Segmental and somatic dysfunction of lumbar region: Secondary | ICD-10-CM | POA: Diagnosis not present

## 2015-11-21 DIAGNOSIS — M47812 Spondylosis without myelopathy or radiculopathy, cervical region: Secondary | ICD-10-CM | POA: Diagnosis not present

## 2015-11-23 DIAGNOSIS — M9901 Segmental and somatic dysfunction of cervical region: Secondary | ICD-10-CM | POA: Diagnosis not present

## 2015-11-23 DIAGNOSIS — M47812 Spondylosis without myelopathy or radiculopathy, cervical region: Secondary | ICD-10-CM | POA: Diagnosis not present

## 2015-11-23 DIAGNOSIS — M9902 Segmental and somatic dysfunction of thoracic region: Secondary | ICD-10-CM | POA: Diagnosis not present

## 2015-11-23 DIAGNOSIS — M9903 Segmental and somatic dysfunction of lumbar region: Secondary | ICD-10-CM | POA: Diagnosis not present

## 2015-11-23 DIAGNOSIS — S134XXA Sprain of ligaments of cervical spine, initial encounter: Secondary | ICD-10-CM | POA: Diagnosis not present

## 2015-11-28 DIAGNOSIS — E782 Mixed hyperlipidemia: Secondary | ICD-10-CM | POA: Diagnosis not present

## 2015-11-28 DIAGNOSIS — M545 Low back pain: Secondary | ICD-10-CM | POA: Diagnosis not present

## 2015-11-28 DIAGNOSIS — I10 Essential (primary) hypertension: Secondary | ICD-10-CM | POA: Diagnosis not present

## 2015-11-28 DIAGNOSIS — E039 Hypothyroidism, unspecified: Secondary | ICD-10-CM | POA: Diagnosis not present

## 2015-11-29 DIAGNOSIS — M9903 Segmental and somatic dysfunction of lumbar region: Secondary | ICD-10-CM | POA: Diagnosis not present

## 2015-11-29 DIAGNOSIS — M9901 Segmental and somatic dysfunction of cervical region: Secondary | ICD-10-CM | POA: Diagnosis not present

## 2015-11-29 DIAGNOSIS — M9902 Segmental and somatic dysfunction of thoracic region: Secondary | ICD-10-CM | POA: Diagnosis not present

## 2015-11-29 DIAGNOSIS — M47812 Spondylosis without myelopathy or radiculopathy, cervical region: Secondary | ICD-10-CM | POA: Diagnosis not present

## 2015-11-29 DIAGNOSIS — S134XXA Sprain of ligaments of cervical spine, initial encounter: Secondary | ICD-10-CM | POA: Diagnosis not present

## 2015-12-06 DIAGNOSIS — M47812 Spondylosis without myelopathy or radiculopathy, cervical region: Secondary | ICD-10-CM | POA: Diagnosis not present

## 2015-12-06 DIAGNOSIS — M9901 Segmental and somatic dysfunction of cervical region: Secondary | ICD-10-CM | POA: Diagnosis not present

## 2015-12-06 DIAGNOSIS — M9903 Segmental and somatic dysfunction of lumbar region: Secondary | ICD-10-CM | POA: Diagnosis not present

## 2015-12-06 DIAGNOSIS — S134XXA Sprain of ligaments of cervical spine, initial encounter: Secondary | ICD-10-CM | POA: Diagnosis not present

## 2015-12-06 DIAGNOSIS — M9902 Segmental and somatic dysfunction of thoracic region: Secondary | ICD-10-CM | POA: Diagnosis not present

## 2015-12-17 DIAGNOSIS — M25512 Pain in left shoulder: Secondary | ICD-10-CM | POA: Diagnosis not present

## 2015-12-17 DIAGNOSIS — M79652 Pain in left thigh: Secondary | ICD-10-CM | POA: Diagnosis not present

## 2015-12-17 DIAGNOSIS — G894 Chronic pain syndrome: Secondary | ICD-10-CM | POA: Diagnosis not present

## 2015-12-17 DIAGNOSIS — M545 Low back pain: Secondary | ICD-10-CM | POA: Diagnosis not present

## 2015-12-17 DIAGNOSIS — Z79891 Long term (current) use of opiate analgesic: Secondary | ICD-10-CM | POA: Diagnosis not present

## 2016-01-07 ENCOUNTER — Encounter: Payer: Self-pay | Admitting: Internal Medicine

## 2016-01-14 DIAGNOSIS — G894 Chronic pain syndrome: Secondary | ICD-10-CM | POA: Diagnosis not present

## 2016-01-14 DIAGNOSIS — M79652 Pain in left thigh: Secondary | ICD-10-CM | POA: Diagnosis not present

## 2016-01-14 DIAGNOSIS — Z79891 Long term (current) use of opiate analgesic: Secondary | ICD-10-CM | POA: Diagnosis not present

## 2016-01-14 DIAGNOSIS — M25512 Pain in left shoulder: Secondary | ICD-10-CM | POA: Diagnosis not present

## 2016-01-14 DIAGNOSIS — M545 Low back pain: Secondary | ICD-10-CM | POA: Diagnosis not present

## 2016-01-23 ENCOUNTER — Ambulatory Visit: Payer: Medicare Other | Admitting: Nurse Practitioner

## 2016-01-23 ENCOUNTER — Encounter: Payer: Self-pay | Admitting: Nurse Practitioner

## 2016-02-11 DIAGNOSIS — M25552 Pain in left hip: Secondary | ICD-10-CM | POA: Diagnosis not present

## 2016-02-11 DIAGNOSIS — G894 Chronic pain syndrome: Secondary | ICD-10-CM | POA: Diagnosis not present

## 2016-02-11 DIAGNOSIS — M545 Low back pain: Secondary | ICD-10-CM | POA: Diagnosis not present

## 2016-02-11 DIAGNOSIS — Z79891 Long term (current) use of opiate analgesic: Secondary | ICD-10-CM | POA: Diagnosis not present

## 2016-02-11 DIAGNOSIS — M25512 Pain in left shoulder: Secondary | ICD-10-CM | POA: Diagnosis not present

## 2016-02-14 ENCOUNTER — Ambulatory Visit: Payer: Medicare Other | Admitting: Nurse Practitioner

## 2016-02-27 DIAGNOSIS — N401 Enlarged prostate with lower urinary tract symptoms: Secondary | ICD-10-CM | POA: Diagnosis not present

## 2016-02-27 DIAGNOSIS — R350 Frequency of micturition: Secondary | ICD-10-CM | POA: Diagnosis not present

## 2016-02-27 DIAGNOSIS — I10 Essential (primary) hypertension: Secondary | ICD-10-CM | POA: Diagnosis not present

## 2016-02-27 DIAGNOSIS — G8921 Chronic pain due to trauma: Secondary | ICD-10-CM | POA: Diagnosis not present

## 2016-02-27 DIAGNOSIS — E039 Hypothyroidism, unspecified: Secondary | ICD-10-CM | POA: Diagnosis not present

## 2016-03-04 ENCOUNTER — Encounter: Payer: Self-pay | Admitting: Nurse Practitioner

## 2016-03-04 ENCOUNTER — Ambulatory Visit (INDEPENDENT_AMBULATORY_CARE_PROVIDER_SITE_OTHER): Payer: Medicare Other | Admitting: Nurse Practitioner

## 2016-03-04 DIAGNOSIS — K7460 Unspecified cirrhosis of liver: Secondary | ICD-10-CM | POA: Diagnosis not present

## 2016-03-04 DIAGNOSIS — Z8619 Personal history of other infectious and parasitic diseases: Secondary | ICD-10-CM

## 2016-03-04 LAB — CBC WITH DIFFERENTIAL/PLATELET
BASOS ABS: 0 {cells}/uL (ref 0–200)
BASOS PCT: 0 %
EOS ABS: 200 {cells}/uL (ref 15–500)
Eosinophils Relative: 4 %
HCT: 35.2 % — ABNORMAL LOW (ref 38.5–50.0)
HEMOGLOBIN: 11.6 g/dL — AB (ref 13.2–17.1)
LYMPHS ABS: 1550 {cells}/uL (ref 850–3900)
Lymphocytes Relative: 31 %
MCH: 27.9 pg (ref 27.0–33.0)
MCHC: 33 g/dL (ref 32.0–36.0)
MCV: 84.6 fL (ref 80.0–100.0)
MPV: 8.8 fL (ref 7.5–12.5)
Monocytes Absolute: 450 cells/uL (ref 200–950)
Monocytes Relative: 9 %
NEUTROS ABS: 2800 {cells}/uL (ref 1500–7800)
Neutrophils Relative %: 56 %
Platelets: 192 10*3/uL (ref 140–400)
RBC: 4.16 MIL/uL — ABNORMAL LOW (ref 4.20–5.80)
RDW: 13.1 % (ref 11.0–15.0)
WBC: 5 10*3/uL (ref 3.8–10.8)

## 2016-03-04 LAB — COMPREHENSIVE METABOLIC PANEL
ALBUMIN: 4.1 g/dL (ref 3.6–5.1)
ALK PHOS: 65 U/L (ref 40–115)
ALT: 11 U/L (ref 9–46)
AST: 16 U/L (ref 10–35)
BUN: 24 mg/dL (ref 7–25)
CHLORIDE: 105 mmol/L (ref 98–110)
CO2: 29 mmol/L (ref 20–31)
Calcium: 9.7 mg/dL (ref 8.6–10.3)
Creat: 1.32 mg/dL — ABNORMAL HIGH (ref 0.70–1.25)
Glucose, Bld: 97 mg/dL (ref 65–99)
Potassium: 5 mmol/L (ref 3.5–5.3)
SODIUM: 139 mmol/L (ref 135–146)
TOTAL PROTEIN: 7.2 g/dL (ref 6.1–8.1)
Total Bilirubin: 0.6 mg/dL (ref 0.2–1.2)

## 2016-03-04 LAB — PROTIME-INR
INR: 1
PROTHROMBIN TIME: 10.5 s (ref 9.0–11.5)

## 2016-03-04 LAB — AFP TUMOR MARKER: AFP-Tumor Marker: 1.2 ng/mL (ref <6.1)

## 2016-03-04 NOTE — Progress Notes (Signed)
cc'ed to pcp °
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
## 2016-03-04 NOTE — Assessment & Plan Note (Signed)
Ultrasound elastography with Matavir score F3/F4, essentially borderline/early cirrhosis. This is related to hepatitis C which is now been treated and achieved sustained viral response in summer of 2017, per the patient. At this point I will update his labs including CBC, CMP, PTT/INR, AFP. Return for follow-up in 6 months so we can update ultrasound elastography and look for improvement in his scarring. Generally asymptomatic from a hepatic standpoint. We will also request records from his previous GI to document SVR.

## 2016-03-04 NOTE — Assessment & Plan Note (Signed)
Noted history of hepatitis C status post antiretroviral treatment in summer of 2017 and sustained viral response per the patient. We will request records from his previous GI to document this. Ultrasound elastography did note F3/4 essentially borderline/mild cirrhosis. Return for follow-up in 6 months.

## 2016-03-04 NOTE — Patient Instructions (Signed)
1. Have your labs drawn when you're able to. 2. We will request her previous records from Dr. Britta Mccreedy. 3. Return for follow-up in 6 months. Call us if you have any new, worsening, recurrent symptoms and need to be seen sooner.

## 2016-03-26 DIAGNOSIS — E039 Hypothyroidism, unspecified: Secondary | ICD-10-CM | POA: Diagnosis not present

## 2016-03-26 DIAGNOSIS — G8921 Chronic pain due to trauma: Secondary | ICD-10-CM | POA: Diagnosis not present

## 2016-03-26 DIAGNOSIS — I10 Essential (primary) hypertension: Secondary | ICD-10-CM | POA: Diagnosis not present

## 2016-03-26 DIAGNOSIS — N401 Enlarged prostate with lower urinary tract symptoms: Secondary | ICD-10-CM | POA: Diagnosis not present

## 2016-04-23 DIAGNOSIS — I10 Essential (primary) hypertension: Secondary | ICD-10-CM | POA: Diagnosis not present

## 2016-04-23 DIAGNOSIS — G8921 Chronic pain due to trauma: Secondary | ICD-10-CM | POA: Diagnosis not present

## 2016-04-23 DIAGNOSIS — E782 Mixed hyperlipidemia: Secondary | ICD-10-CM | POA: Diagnosis not present

## 2016-04-23 DIAGNOSIS — N401 Enlarged prostate with lower urinary tract symptoms: Secondary | ICD-10-CM | POA: Diagnosis not present

## 2016-05-06 DIAGNOSIS — R05 Cough: Secondary | ICD-10-CM | POA: Diagnosis not present

## 2016-06-17 DIAGNOSIS — Z9889 Other specified postprocedural states: Secondary | ICD-10-CM | POA: Diagnosis not present

## 2016-06-17 DIAGNOSIS — R918 Other nonspecific abnormal finding of lung field: Secondary | ICD-10-CM | POA: Diagnosis not present

## 2016-06-17 DIAGNOSIS — R0789 Other chest pain: Secondary | ICD-10-CM | POA: Diagnosis not present

## 2016-09-01 ENCOUNTER — Ambulatory Visit: Payer: Medicare Other | Admitting: Nurse Practitioner

## 2016-10-17 ENCOUNTER — Ambulatory Visit (INDEPENDENT_AMBULATORY_CARE_PROVIDER_SITE_OTHER): Payer: Medicare Other | Admitting: Gastroenterology

## 2016-10-17 ENCOUNTER — Encounter: Payer: Self-pay | Admitting: Gastroenterology

## 2016-10-17 VITALS — BP 136/71 | HR 56 | Temp 98.2°F | Ht 77.0 in | Wt 216.2 lb

## 2016-10-17 DIAGNOSIS — K5900 Constipation, unspecified: Secondary | ICD-10-CM

## 2016-10-17 DIAGNOSIS — Z8619 Personal history of other infectious and parasitic diseases: Secondary | ICD-10-CM | POA: Diagnosis not present

## 2016-10-17 DIAGNOSIS — K7460 Unspecified cirrhosis of liver: Secondary | ICD-10-CM | POA: Diagnosis not present

## 2016-10-17 DIAGNOSIS — D649 Anemia, unspecified: Secondary | ICD-10-CM | POA: Insufficient documentation

## 2016-10-17 LAB — PROTIME-INR

## 2016-10-17 NOTE — Progress Notes (Signed)
      Primary Care Physician: Octavio Graves, DO  Primary Gastroenterologist:  Garfield Cornea, MD   Chief Complaint  Patient presents with  . Hepatitis C    HPI: Colton Gibbs is a 61 y.o. male here for follow-up. He has a history of chronic hepatitis C, genotype 2B, F3/F4 fibrosis on elastography. He was treated with Epclusa for 12 weeks. Achieved SVR according Dr. Wylie Hail note. Treated in 2017.   He is due for hepatoma screening, labs. Since we last saw him he had Right inguinal hernia repair 08/2016.   Overall clinically he is doing well. His constipation is controlled with Linzess, but utilizes MiraLAX at times as well. He denies blood in the stool or melena. No abdominal pain. No heartburn. No dysphagia. No vomiting.   Current Outpatient Prescriptions  Medication Sig Dispense Refill  . atorvastatin (LIPITOR) 20 MG tablet Take 20 mg by mouth daily at 6 PM.     . benazepril-hydrochlorthiazide (LOTENSIN HCT) 20-12.5 MG tablet     . levothyroxine (SYNTHROID, LEVOTHROID) 150 MCG tablet Take 150 mcg by mouth daily before breakfast.     . LINZESS 290 MCG CAPS capsule     . oxycodone (ROXICODONE) 30 MG immediate release tablet     . quinapril-hydrochlorothiazide (ACCURETIC) 20-25 MG per tablet Take 1 tablet by mouth daily.     No current facility-administered medications for this visit.     Allergies as of 10/17/2016 - Review Complete 10/17/2016  Allergen Reaction Noted  . Penicillins Other (See Comments) 12/03/2010    ROS:  General: Negative for anorexia, weight loss, fever, chills, fatigue, weakness. ENT: Negative for hoarseness, difficulty swallowing , nasal congestion. CV: Negative for chest pain, angina, palpitations, dyspnea on exertion, peripheral edema.  Respiratory: Negative for dyspnea at rest, dyspnea on exertion, cough, sputum, wheezing.  GI: See history of present illness. GU:  Negative for dysuria, hematuria, urinary incontinence, urinary frequency, nocturnal  urination.  Endo: Negative for unusual weight change.    Physical Examination:   BP 136/71   Pulse (!) 56   Temp 98.2 F (36.8 C) (Oral)   Ht 6\' 5"  (1.956 m)   Wt 216 lb 3.2 oz (98.1 kg)   BMI 25.64 kg/m   General: Well-nourished, well-developed in no acute distress.  Eyes: No icterus. Mouth: Oropharyngeal mucosa moist and pink , no lesions erythema or exudate. Lungs: Clear to auscultation bilaterally.  Heart: Regular rate and rhythm, no murmurs rubs or gallops.  Abdomen: Bowel sounds are normal, nontender, nondistended, no hepatosplenomegaly or masses, no abdominal bruits or hernia , no rebound or guarding.   Extremities: No lower extremity edema. No clubbing or deformities. Neuro: Alert and oriented x 4   Skin: Warm and dry, no jaundice.   Psych: Alert and cooperative, normal mood and affect.  Labs:  Lab Results  Component Value Date   CREATININE 1.32 (H) 03/04/2016   BUN 24 03/04/2016   NA 139 03/04/2016   K 5.0 03/04/2016   CL 105 03/04/2016   CO2 29 03/04/2016   Lab Results  Component Value Date   ALT 11 03/04/2016   AST 16 03/04/2016   ALKPHOS 65 03/04/2016   BILITOT 0.6 03/04/2016   Lab Results  Component Value Date   WBC 5.0 03/04/2016   HGB 11.6 (L) 03/04/2016   HCT 35.2 (L) 03/04/2016   MCV 84.6 03/04/2016   PLT 192 03/04/2016    Imaging Studies: No results found.

## 2016-10-17 NOTE — Progress Notes (Signed)
CC'D TO PCP °

## 2016-10-17 NOTE — Assessment & Plan Note (Signed)
F/u labs at this time.

## 2016-10-17 NOTE — Patient Instructions (Signed)
1. Please have your labs done. 2. Please have your ultrasound done.  3. Return to the office in six months or call sooner if needed.

## 2016-10-17 NOTE — Assessment & Plan Note (Signed)
History of chronic hepatitis C, genotype 2B. Completed 12 weeks of Epclusa. Per Dr. Wylie Hail note achieved SVR. He had F3/F4 scores on elastography. Has been managed has early cirrhosis with hepatoma surveillance, labs. No prior upper endoscopy.He is due for labs at this time. He has not had an ultrasound since his treatment therefore we will pursue that at this time. Fibrosure test to help sort out degree of fibrosis.    If evidence of overt cirrhosis he will also need esophageal variceal screening in near future.

## 2016-10-17 NOTE — Assessment & Plan Note (Signed)
Doing well at this time. Continue current regimen. Return to the office in 6 months or call sooner if needed.

## 2016-10-21 ENCOUNTER — Ambulatory Visit (HOSPITAL_COMMUNITY)
Admission: RE | Admit: 2016-10-21 | Discharge: 2016-10-21 | Disposition: A | Discharge status: Home / Self Care | Payer: Medicare Other | Source: Ambulatory Visit | Attending: Gastroenterology | Admitting: Gastroenterology

## 2016-10-21 DIAGNOSIS — K5900 Constipation, unspecified: Secondary | ICD-10-CM

## 2016-10-21 DIAGNOSIS — Z8619 Personal history of other infectious and parasitic diseases: Secondary | ICD-10-CM | POA: Diagnosis not present

## 2016-10-21 DIAGNOSIS — K7460 Unspecified cirrhosis of liver: Secondary | ICD-10-CM | POA: Insufficient documentation

## 2016-11-03 NOTE — Progress Notes (Signed)
Fatty liver appearing on u/s. No overt findings of cirrhosis.  Await labs.

## 2016-11-13 ENCOUNTER — Telehealth: Payer: Self-pay | Admitting: Internal Medicine

## 2016-11-13 NOTE — Telephone Encounter (Signed)
Returned pts call in reference to his blood work. Pt wasn't sure what steps he needed to take to get his blood work done. Explained to pt that his order was put in at [facility] for his blood work on 10/18/16 and he can got there to have his lab work done. Pt will call back with any other concerns he has.

## 2016-11-13 NOTE — Telephone Encounter (Signed)
[phone]  PLEASE CALL PATIENT, HE HAS QUESTIONS ABOUT THE LETTER HE RECEIVED FOR HIS LAB WORK THAT NEEDS TO BE DONE

## 2016-11-15 LAB — HCV FIBROSURE
ALPHA 2-MACROGLOBULINS, QN: 292 mg/dL — AB (ref 110–276)
ALT (SGPT) P5P: 20 IU/L (ref 0–55)
APOLIPOPROTEIN A I: 159 mg/dL (ref 101–178)
BILIRUBIN, TOTAL: 0.4 mg/dL (ref 0.0–1.2)
Fibrosis Score: 0.37 — ABNORMAL HIGH (ref 0.00–0.21)
GGT: 13 IU/L (ref 0–65)
Haptoglobin: 73 mg/dL (ref 34–200)
Necroinflammat Activity Score: 0.09 (ref 0.00–0.17)

## 2016-11-15 LAB — COMPREHENSIVE METABOLIC PANEL
A/G RATIO: 1.4 (ref 1.2–2.2)
ALT: 14 IU/L (ref 0–44)
AST: 22 IU/L (ref 0–40)
Albumin: 4.6 g/dL (ref 3.6–4.8)
Alkaline Phosphatase: 86 IU/L (ref 39–117)
BUN/Creatinine Ratio: 24 (ref 10–24)
BUN: 29 mg/dL — ABNORMAL HIGH (ref 8–27)
Bilirubin Total: 0.4 mg/dL (ref 0.0–1.2)
CALCIUM: 9.4 mg/dL (ref 8.6–10.2)
CO2: 21 mmol/L (ref 20–29)
Chloride: 104 mmol/L (ref 96–106)
Creatinine, Ser: 1.22 mg/dL (ref 0.76–1.27)
GFR, EST AFRICAN AMERICAN: 73 mL/min/{1.73_m2} (ref >59)
GFR, EST NON AFRICAN AMERICAN: 63 mL/min/{1.73_m2} (ref >59)
Globulin, Total: 3.2 g/dL (ref 1.5–4.5)
Glucose: 118 mg/dL — ABNORMAL HIGH (ref 65–99)
POTASSIUM: 4.7 mmol/L (ref 3.5–5.2)
Sodium: 139 mmol/L (ref 134–144)
TOTAL PROTEIN: 7.8 g/dL (ref 6.0–8.5)

## 2016-11-15 LAB — CBC WITH DIFFERENTIAL/PLATELET
BASOS: 0 %
Basophils Absolute: 0 10*3/uL (ref 0.0–0.2)
EOS (ABSOLUTE): 0.2 10*3/uL (ref 0.0–0.4)
Eos: 2 %
Hematocrit: 37 % — ABNORMAL LOW (ref 37.5–51.0)
Hemoglobin: 12.3 g/dL — ABNORMAL LOW (ref 13.0–17.7)
IMMATURE GRANS (ABS): 0 10*3/uL (ref 0.0–0.1)
IMMATURE GRANULOCYTES: 0 %
LYMPHS: 16 %
Lymphocytes Absolute: 1.2 10*3/uL (ref 0.7–3.1)
MCH: 28.7 pg (ref 26.6–33.0)
MCHC: 33.2 g/dL (ref 31.5–35.7)
MCV: 86 fL (ref 79–97)
MONOS ABS: 0.5 10*3/uL (ref 0.1–0.9)
Monocytes: 6 %
NEUTROS PCT: 76 %
Neutrophils Absolute: 5.4 10*3/uL (ref 1.4–7.0)
PLATELETS: 216 10*3/uL (ref 150–379)
RBC: 4.28 x10E6/uL (ref 4.14–5.80)
RDW: 12.7 % (ref 12.3–15.4)
WBC: 7.3 10*3/uL (ref 3.4–10.8)

## 2016-11-15 LAB — PROTIME-INR
INR: 1 (ref 0.8–1.2)
PROTHROMBIN TIME: 10.2 s (ref 9.1–12.0)

## 2016-11-15 LAB — IRON,TIBC AND FERRITIN PANEL
FERRITIN: 52 ng/mL (ref 30–400)
Iron Saturation: 23 % (ref 15–55)
Iron: 64 ug/dL (ref 38–169)
TIBC: 283 ug/dL (ref 250–450)
UIBC: 219 ug/dL (ref 111–343)

## 2016-11-15 LAB — HCV RNA QUANT RFLX ULTRA OR GENOTYP: HCV Quant Baseline: NOT DETECTED IU/mL

## 2016-11-15 LAB — AFP TUMOR MARKER: AFP, Serum, Tumor Marker: 1.3 ng/mL (ref 0.0–8.3)

## 2016-11-24 NOTE — Progress Notes (Signed)
HCV not detected, successfully treated Hep C. LFTs normal.  Anemia persistent but improved.  No evidence of iron deficiency anemia. HCV fibro-sure test indicates F1 and F2.  Previous ultrasound indicated F3/F4 prior to treatment.  Current ultrasound with no overt cirrhosis. I will discuss with [facility] to determine if ongoing hepatoma surveillance required.   For anemia, let's have him complete ifobt. His last TCS was in 2014.

## 2016-11-25 ENCOUNTER — Ambulatory Visit (INDEPENDENT_AMBULATORY_CARE_PROVIDER_SITE_OTHER): Payer: Medicare Other | Admitting: Gastroenterology

## 2016-11-25 DIAGNOSIS — D649 Anemia, unspecified: Secondary | ICD-10-CM | POA: Diagnosis not present

## 2016-11-26 LAB — IFOBT (OCCULT BLOOD): IFOBT: NEGATIVE

## 2016-11-26 NOTE — Progress Notes (Signed)
IFOBT negative 

## 2016-12-15 ENCOUNTER — Other Ambulatory Visit: Payer: Self-pay

## 2016-12-15 DIAGNOSIS — D649 Anemia, unspecified: Secondary | ICD-10-CM

## 2016-12-15 NOTE — Progress Notes (Signed)
ifobt negative. Last Hgb still slightly low but improved. TCS 2014. No prior EGD to my knowledge.   -->Plan on ruq u/s in 03/2017 for hepatoma screening. PLEASE NIC. Discussed with Roosevelt Locks at [facility] liver care. Patient's elastography showed F3/F4 prior to HCV tx. Fibrosure F1/F2 post-treatment. Either continue surveillance u/s every six months and treat as high level fibrosis with need for lifelong hepatoma surveillance OR offer patient liver biopsy to sort out. We can discuss at next oV.  -->Repeat CBC in 4-6 weeks.  -->Keep OV in 03/2017 as planned.

## 2016-12-15 NOTE — Progress Notes (Signed)
ON RECALL FOR ULTRASOUND  °

## 2017-01-02 ENCOUNTER — Telehealth: Payer: Self-pay | Admitting: Internal Medicine

## 2017-01-02 NOTE — Telephone Encounter (Signed)
PATIENT LEFT MESSAGE TO SAY THAT HE WILL NOT BE ABLE TO GO TO [place] Monday TO HAVE HIS LABS DONE DUE TO POSSIBLE BAD WEATHER.

## 2017-01-02 NOTE — Telephone Encounter (Signed)
Spoke with pt, ok to get lab work next week sometime if weather is bad.

## 2017-01-14 ENCOUNTER — Other Ambulatory Visit (HOSPITAL_COMMUNITY)
Admission: RE | Admit: 2017-01-14 | Discharge: 2017-01-14 | Disposition: A | Discharge status: Home / Self Care | Payer: Medicare Other | Source: Ambulatory Visit | Attending: Gastroenterology | Admitting: Gastroenterology

## 2017-01-14 DIAGNOSIS — D649 Anemia, unspecified: Secondary | ICD-10-CM | POA: Diagnosis present

## 2017-01-14 LAB — CBC WITH DIFFERENTIAL/PLATELET
BASOS PCT: 0 %
Basophils Absolute: 0 10*3/uL (ref 0.0–0.1)
Eosinophils Absolute: 0.1 10*3/uL (ref 0.0–0.7)
Eosinophils Relative: 3 %
HEMATOCRIT: 35 % — AB (ref 39.0–52.0)
HEMOGLOBIN: 11.5 g/dL — AB (ref 13.0–17.0)
LYMPHS ABS: 1.3 10*3/uL (ref 0.7–4.0)
Lymphocytes Relative: 24 %
MCH: 28.7 pg (ref 26.0–34.0)
MCHC: 32.9 g/dL (ref 30.0–36.0)
MCV: 87.3 fL (ref 78.0–100.0)
MONOS PCT: 7 %
Monocytes Absolute: 0.4 10*3/uL (ref 0.1–1.0)
NEUTROS ABS: 3.7 10*3/uL (ref 1.7–7.7)
NEUTROS PCT: 66 %
Platelets: 201 10*3/uL (ref 150–400)
RBC: 4.01 MIL/uL — ABNORMAL LOW (ref 4.22–5.81)
RDW: 12 % (ref 11.5–15.5)
WBC: 5.5 10*3/uL (ref 4.0–10.5)

## 2017-01-31 NOTE — Progress Notes (Signed)
Please let patient know his Hgb is still low with slight decline.  Let's get him back into the office to address anemia.

## 2017-02-02 NOTE — Progress Notes (Signed)
PATIENT SCHEDULED  °

## 2017-02-24 ENCOUNTER — Telehealth: Payer: Self-pay | Admitting: Internal Medicine

## 2017-02-24 NOTE — Telephone Encounter (Signed)
Letter mailed

## 2017-02-24 NOTE — Telephone Encounter (Signed)
RECALL FOR ULTRASOUND 

## 2017-03-24 ENCOUNTER — Encounter: Payer: Self-pay | Admitting: Gastroenterology

## 2017-03-24 ENCOUNTER — Encounter: Payer: Self-pay | Admitting: *Deleted

## 2017-03-24 ENCOUNTER — Ambulatory Visit (INDEPENDENT_AMBULATORY_CARE_PROVIDER_SITE_OTHER): Payer: Medicare Other | Admitting: Gastroenterology

## 2017-03-24 VITALS — BP 117/62 | HR 71 | Temp 96.8°F | Ht 77.0 in | Wt 230.6 lb

## 2017-03-24 DIAGNOSIS — K5903 Drug induced constipation: Secondary | ICD-10-CM | POA: Diagnosis not present

## 2017-03-24 DIAGNOSIS — K7460 Unspecified cirrhosis of liver: Secondary | ICD-10-CM

## 2017-03-24 DIAGNOSIS — D649 Anemia, unspecified: Secondary | ICD-10-CM

## 2017-03-24 MED ORDER — METHYLNALTREXONE BROMIDE 150 MG PO TABS: 150.0000 mg | ORAL_TABLET | Freq: Every day | ORAL | 1 refills | Status: DC

## 2017-03-24 NOTE — Progress Notes (Signed)
      Primary Care Physician: Octavio Graves, DO  Primary Gastroenterologist:  Garfield Cornea, MD   Chief Complaint  Patient presents with  . Anemia    f/u  . Hepatitis C    history  . Constipation    HPI: Colton Gibbs is a 62 y.o. male with h/o hepatitis C s/p eradication with F3/F4 fibrosis on elastography, anemia, constipation. Last seen 09/2016. Fibrosure testing F1/F2 post HCV tx. Discussed with Roosevelt Locks, NP at [facility] liver care. Would advise lifelong hepatoma surveillance vs offering liver biopsy to determine if cirrhosis or high grade fibrosis given discordance between elastography and Fibrosure.   Patient is due u/s at this time for hepatoma screening. Will update labs as well and f/u anemia.   Patient has had trouble with constipation ever since being place on pain medication. He has tried Linzess, colace without relief. Mag Citrate on occasion works. Will go 3-4 days without a BM. Denies abd pain, melena, brbpr. No ugi symptoms.  No prior egd. Last tcs 2015 by Dr. Britta Mccreedy, diverticulosis.   Current Outpatient Medications  Medication Sig Dispense Refill  . atorvastatin (LIPITOR) 20 MG tablet Take 20 mg by mouth daily at 6 PM.     . benazepril-hydrochlorthiazide (LOTENSIN HCT) 20-12.5 MG tablet     . docusate sodium (COLACE) 100 MG capsule Take 100 mg by mouth daily.    Marland Kitchen levothyroxine (SYNTHROID, LEVOTHROID) 150 MCG tablet Take 150 mcg by mouth daily before breakfast.     . LINZESS 290 MCG CAPS capsule Take 290 mcg by mouth daily before breakfast.     . Multiple Vitamin (MULTIVITAMIN) tablet Take 1 tablet by mouth daily.    Marland Kitchen oxycodone (ROXICODONE) 30 MG immediate release tablet Take 30 mg by mouth 5 (five) times daily.     . tamsulosin (FLOMAX) 0.4 MG CAPS capsule Take 0.4 mg by mouth daily.     No current facility-administered medications for this visit.     Allergies as of 03/24/2017 - Review Complete 03/24/2017  Allergen Reaction Noted  . Penicillins Other (See  Comments) 12/03/2010    ROS:  General: Negative for anorexia, weight loss, fever, chills, fatigue, weakness. ENT: Negative for hoarseness, difficulty swallowing , nasal congestion. CV: Negative for chest pain, angina, palpitations, dyspnea on exertion, peripheral edema.  Respiratory: Negative for dyspnea at rest, dyspnea on exertion, cough, sputum, wheezing.  GI: See history of present illness. GU:  Negative for dysuria, hematuria, urinary incontinence, urinary frequency, nocturnal urination.  Endo: Negative for unusual weight change.    Physical Examination:   BP 117/62   Pulse 71   Temp (!) 96.8 F (36 C) (Oral)   Ht 6\' 5"  (1.956 m)   Wt 230 lb 9.6 oz (104.6 kg)   BMI 27.35 kg/m   General: Well-nourished, well-developed in no acute distress.  Eyes: No icterus. Mouth: Oropharyngeal mucosa moist and pink , no lesions erythema or exudate. Lungs: Clear to auscultation bilaterally.  Heart: Regular rate and rhythm, no murmurs rubs or gallops.  Abdomen: Bowel sounds are normal, nontender, nondistended, no hepatosplenomegaly or masses, no abdominal bruits or hernia , no rebound or guarding.   Extremities: No lower extremity edema. No clubbing or deformities. Neuro: Alert and oriented x 4   Skin: Warm and dry, no jaundice.   Psych: Alert and cooperative, normal mood and affect.  Imaging Studies: No results found.

## 2017-03-24 NOTE — Patient Instructions (Signed)
1. We are going to go ahead and update labs now because of your anemia. We need to go ahead and follow up on that so we can determine if anything else needs to be done, like a colonoscopy. 2. Ultrasound of liver as scheduled.  3. Start Relistor for constipation. Take one before breakfast on empty stomach. We are starting at a lower dose but may be able to increase in future if needed. Once you have taken for several days, you may be able to stop the Linzess.  4. Return to the office in six months.

## 2017-03-25 LAB — CBC WITH DIFFERENTIAL/PLATELET
Basophils Absolute: 0 x10E3/uL (ref 0.0–0.2)
Basos: 0 %
EOS (ABSOLUTE): 0.2 x10E3/uL (ref 0.0–0.4)
Eos: 3 %
Hematocrit: 33.9 % — ABNORMAL LOW (ref 37.5–51.0)
Hemoglobin: 11.2 g/dL — ABNORMAL LOW (ref 13.0–17.7)
Immature Grans (Abs): 0 x10E3/uL (ref 0.0–0.1)
Immature Granulocytes: 0 %
Lymphocytes Absolute: 1.4 x10E3/uL (ref 0.7–3.1)
Lymphs: 24 %
MCH: 28.7 pg (ref 26.6–33.0)
MCHC: 33 g/dL (ref 31.5–35.7)
MCV: 87 fL (ref 79–97)
Monocytes Absolute: 0.4 x10E3/uL (ref 0.1–0.9)
Monocytes: 7 %
Neutrophils Absolute: 3.8 x10E3/uL (ref 1.4–7.0)
Neutrophils: 66 %
Platelets: 217 x10E3/uL (ref 150–379)
RBC: 3.9 x10E6/uL — ABNORMAL LOW (ref 4.14–5.80)
RDW: 13.1 % (ref 12.3–15.4)
WBC: 5.8 x10E3/uL (ref 3.4–10.8)

## 2017-03-25 LAB — COMPREHENSIVE METABOLIC PANEL WITH GFR
ALT: 19 IU/L (ref 0–44)
AST: 19 IU/L (ref 0–40)
Albumin/Globulin Ratio: 1.4 (ref 1.2–2.2)
Albumin: 4.4 g/dL (ref 3.6–4.8)
Alkaline Phosphatase: 82 IU/L (ref 39–117)
BUN/Creatinine Ratio: 15 (ref 10–24)
BUN: 24 mg/dL (ref 8–27)
Bilirubin Total: 0.4 mg/dL (ref 0.0–1.2)
CO2: 23 mmol/L (ref 20–29)
Calcium: 9.6 mg/dL (ref 8.6–10.2)
Chloride: 103 mmol/L (ref 96–106)
Creatinine, Ser: 1.56 mg/dL — ABNORMAL HIGH (ref 0.76–1.27)
GFR calc Af Amer: 54 mL/min/1.73 — ABNORMAL LOW
GFR calc non Af Amer: 47 mL/min/1.73 — ABNORMAL LOW
Globulin, Total: 3.1 g/dL (ref 1.5–4.5)
Glucose: 85 mg/dL (ref 65–99)
Potassium: 5.1 mmol/L (ref 3.5–5.2)
Sodium: 141 mmol/L (ref 134–144)
Total Protein: 7.5 g/dL (ref 6.0–8.5)

## 2017-03-25 LAB — AFP TUMOR MARKER: AFP, Serum, Tumor Marker: 1.3 ng/mL (ref 0.0–8.3)

## 2017-03-25 LAB — PROTIME-INR
INR: 1 (ref 0.8–1.2)
Prothrombin Time: 10.3 s (ref 9.1–12.0)

## 2017-03-30 NOTE — Assessment & Plan Note (Signed)
Not doing well on Linzess 236mcg daily. Has tried colace and miralax in the past without relief. Likely opioid induced constipation. Will try Relistor, start at low dose of 150mg  daily as he has had elevated creatinine in the past. May be able to increase dose if future labs show good renal function.

## 2017-03-30 NOTE — Assessment & Plan Note (Signed)
Normocytic anemia without evidence of IDA, heme negative stool. Last TCS 2015. Update labs, further recommendations to follow.

## 2017-03-30 NOTE — Assessment & Plan Note (Signed)
H/o HCV s/p eradication. Pre treatment elastography with F3/F4 scores. Post tx Fibrosure was F1/F2. Given discordance in results, would air on side of chronic hepatoma screening although if patient decides to he can have liver biopsy to determine if high grade fibrosis or cirrhosis is present (is none then he would not need lifelong hepatoma surveillance per guidelines).   Plan on labs and u/s in near future. Patient has not had EGD to date.

## 2017-03-31 ENCOUNTER — Ambulatory Visit (HOSPITAL_COMMUNITY): Payer: Medicare Other

## 2017-03-31 NOTE — Progress Notes (Signed)
cc'ed to pcp °

## 2017-04-01 ENCOUNTER — Ambulatory Visit (HOSPITAL_COMMUNITY): Payer: Medicare Other

## 2017-04-01 ENCOUNTER — Ambulatory Visit (HOSPITAL_COMMUNITY)
Admission: RE | Admit: 2017-04-01 | Discharge: 2017-04-01 | Disposition: A | Discharge status: Home / Self Care | Payer: Medicare Other | Source: Ambulatory Visit | Attending: Gastroenterology | Admitting: Gastroenterology

## 2017-04-01 DIAGNOSIS — K7460 Unspecified cirrhosis of liver: Secondary | ICD-10-CM | POA: Diagnosis present

## 2017-04-01 DIAGNOSIS — R932 Abnormal findings on diagnostic imaging of liver and biliary tract: Secondary | ICD-10-CM | POA: Insufficient documentation

## 2017-04-01 DIAGNOSIS — D649 Anemia, unspecified: Secondary | ICD-10-CM

## 2017-04-01 DIAGNOSIS — K5903 Drug induced constipation: Secondary | ICD-10-CM

## 2017-04-01 MED ORDER — NALOXEGOL OXALATE 12.5 MG PO TABS: 12.5000 mg | ORAL_TABLET | Freq: Every day | ORAL | 5 refills | Status: DC

## 2017-04-01 NOTE — Addendum Note (Signed)
Addended by: Mahala Menghini on: 04/01/2017 03:59 PM   Modules accepted: Orders

## 2017-04-01 NOTE — Progress Notes (Signed)
Preferred agents Linzess, Movantik, Amitiza. Relistor not on formulary.   Please let patient know that he has to try some other medications prior to getting Relistor approved. I preferred option of Movantik anyway but Epic made it appear that it wasn't on formulary.   I can send in RX for MOvantik 12.5mg  every morning.

## 2017-04-03 ENCOUNTER — Ambulatory Visit (HOSPITAL_COMMUNITY): Payer: Medicare Other

## 2017-04-07 ENCOUNTER — Telehealth: Payer: Self-pay

## 2017-04-07 NOTE — Telephone Encounter (Signed)
Faxed PA for Movantik, waiting on approval or denial.

## 2017-04-08 NOTE — Progress Notes (Signed)
Creatinine up from four months ago. ?secondary to dehydration versus decline in kidney function.  Patient with stable hemoglobin but mild anemia. Normal iron studies. Heme negative. Last TCS 05/21/12 by Dr. Britta Mccreedy.   Please let patient know that we will repeat labs in six months to follow up on liver function. Please arrange for CBC, CMET, PT/INR, AFP in six months.  I will discuss anemia with Dr. Gala Romney. ?anemia of chronic disease as no evidence of IDA. May or may not need updated colonoscopy. Further recommendations to follow.

## 2017-04-09 ENCOUNTER — Other Ambulatory Visit: Payer: Self-pay

## 2017-04-09 DIAGNOSIS — D649 Anemia, unspecified: Secondary | ICD-10-CM

## 2017-04-09 NOTE — Progress Notes (Signed)
Received letter today stating Movantik has been denied. I'm not sure what's on the pts formulary.

## 2017-04-14 ENCOUNTER — Ambulatory Visit: Payer: Medicare Other | Admitting: Gastroenterology

## 2017-04-15 NOTE — Progress Notes (Addendum)
There was a paper or something that came to me when the Relistor was denied stating the Linzess, Movantik, Amitiza were preferred agents.   Patient already failed Linzess.   Please ask Almyra Free for help.

## 2017-04-15 NOTE — Progress Notes (Signed)
Dr. Gala Romney,   F3/F4 on u/s. HCV successfully treated. No EGD to date. TCS 2014 by Britta Mccreedy.   He has had stable anemia with Hgb 11 for past year. Heme negative recently. Iron studies normal.   Would you advise TCS/EGD?

## 2017-04-22 NOTE — Progress Notes (Signed)
Any further information Elmo Putt or Almyra Free?

## 2017-04-24 NOTE — Progress Notes (Signed)
Looks like they want him to try and fail lactulose. You can do a letter and we can appeal it.

## 2017-04-26 MED ORDER — LUBIPROSTONE 24 MCG PO CAPS: 24.0000 ug | ORAL_CAPSULE | Freq: Two times a day (BID) | ORAL | 5 refills | Status: DC

## 2017-04-26 NOTE — Progress Notes (Signed)
1# Please let patient know his u/s was stable and no hepatoma. PLEASE NIC FOR RUQ U/S IN SIX MONTHS.  2# Per RMR, patient needs TCS/EGD with propofol for anemia, esopphageal variceal screening.        - two full days clear liquids       -has to be trilytely prep due to his renal function       -take dulcolax 15 mg daily for 3 days before his clear liquids start       -tell him to let us know if new rx sent in Amitiza bid is NOT helping his constipation. He needs to be having good BMs the week before his bowel prep to make sure he gets cleaned out.

## 2017-04-26 NOTE — Progress Notes (Signed)
Dr. Gala Romney, see below, TCS/EGD???

## 2017-04-26 NOTE — Progress Notes (Signed)
See lab result note.

## 2017-04-26 NOTE — Addendum Note (Signed)
Addended by: Mahala Menghini on: 04/26/2017 08:45 PM   Modules accepted: Orders

## 2017-04-26 NOTE — Progress Notes (Signed)
Derrick Ravel, CMA  Mahala Menghini, PA-C        I will discuss with Almyra Free. I found out what the problem has been. When I spoke to [facility], they hadn't received any Pharmacy claims for pt. Medicaid's preferred med is Movantik. The pharmacy said Medicare must be filed first and they have been denying the claims. Medicare wants pt to try Amitiza first.      Let's see if Amitiza helps first. I'm sending in RX. Please let patient know about RX and let us know if BMs don't improve. See result note as patient is going to need TCS/EGD.

## 2017-04-27 ENCOUNTER — Other Ambulatory Visit: Payer: Self-pay

## 2017-04-27 DIAGNOSIS — K7460 Unspecified cirrhosis of liver: Secondary | ICD-10-CM

## 2017-04-27 DIAGNOSIS — D649 Anemia, unspecified: Secondary | ICD-10-CM

## 2017-04-27 MED ORDER — PEG 3350-KCL-NA BICARB-NACL 420 G PO SOLR: 4000.0000 mL | ORAL | 0 refills | Status: DC

## 2017-04-27 NOTE — Progress Notes (Signed)
ON RECALL  °

## 2017-04-29 NOTE — Progress Notes (Signed)
Per result note, pt is aware.

## 2017-05-04 ENCOUNTER — Telehealth: Payer: Self-pay

## 2017-05-04 NOTE — Telephone Encounter (Signed)
Pt called office, LMOVM, he doesn't understand TCS instructions he received in mail. Called pt. Offered to explain instructions over the phone. He states he's in a doctors office at this time and will get his "drugist" to help him. Advised him if he had any further questions to let us know.

## 2017-05-05 ENCOUNTER — Telehealth: Payer: Self-pay

## 2017-05-05 NOTE — Telephone Encounter (Signed)
Pt called office, requested to reschedule TCS/EGD w/Propofol w/RMR d/t his ride will not be available 06/08/17. Procedure rescheduled to 06/15/17 at 8:45am. Called and informed Endo scheduler. Pre-op appt rescheduled to 06/10/17 at 12:45pm. Called pt back and informed of new pre-op appt. Letter and procedure instructions mailed.

## 2017-06-01 ENCOUNTER — Other Ambulatory Visit (HOSPITAL_COMMUNITY): Payer: Medicare Other

## 2017-06-04 NOTE — Patient Instructions (Signed)
Colton Gibbs  06/04/2017     @PREFPERIOPPHARMACY @   Your procedure is scheduled on  06/15/2017 .  Report to [facility] at  645   A.M.  Call this number if you have problems the morning of surgery:  [phone]   Remember:  Do not eat food or drink liquids after midnight.  Take these medicines the morning of surgery with A SIP OF WATER  Lotensin, levothyroxine, oxycodone, flomax.   Do not wear jewelry, make-up or nail polish.  Do not wear lotions, powders, or perfumes, or deodorant.  Do not shave 48 hours prior to surgery.  Men may shave face and neck.  Do not bring valuables to the hospital.  [facility] is not responsible for any belongings or valuables.  Contacts, dentures or bridgework may not be worn into surgery.  Leave your suitcase in the car.  After surgery it may be brought to your room.  For patients admitted to the hospital, discharge time will be determined by your treatment team.  Patients discharged the day of surgery will not be allowed to drive home.   Name and phone number of your driver:   Family    Special instructions:  Follow the diet and prep instructions given to you by Dr Roseanne Kaufman office.  Please read over the following fact sheets that you were given. Anesthesia Post-op Instructions and Care and Recovery After Surgery       Esophagogastroduodenoscopy Esophagogastroduodenoscopy (EGD) is a procedure to examine the lining of the esophagus, stomach, and first part of the small intestine (duodenum). This procedure is done to check for problems such as inflammation, bleeding, ulcers, or growths. During this procedure, a long, flexible, lighted tube with a camera attached (endoscope) is inserted down the throat. Tell a health care provider about:  Any allergies you have.  All medicines you are taking, including vitamins, herbs, eye drops, creams, and over-the-counter medicines.  Any problems you or family members have had with  anesthetic medicines.  Any blood disorders you have.  Any surgeries you have had.  Any medical conditions you have.  Whether you are pregnant or may be pregnant. What are the risks? Generally, this is a safe procedure. However, problems may occur, including:  Infection.  Bleeding.  A tear (perforation) in the esophagus, stomach, or duodenum.  Trouble breathing.  Excessive sweating.  Spasms of the larynx.  A slowed heartbeat.  Low blood pressure.  What happens before the procedure?  Follow instructions from your health care provider about eating or drinking restrictions.  Ask your health care provider about: ? Changing or stopping your regular medicines. This is especially important if you are taking diabetes medicines or blood thinners. ? Taking medicines such as aspirin and ibuprofen. These medicines can thin your blood. Do not take these medicines before your procedure if your health care provider instructs you not to.  Plan to have someone take you home after the procedure.  If you wear dentures, be ready to remove them before the procedure. What happens during the procedure?  To reduce your risk of infection, your health care team will wash or sanitize their hands.  An IV tube will be put in a vein in your hand or arm. You will get medicines and fluids through this tube.  You will be given one or more of the following: ? A medicine to help you relax (sedative). ? A medicine to  numb the area (local anesthetic). This medicine may be sprayed into your throat. It will make you feel more comfortable and keep you from gagging or coughing during the procedure. ? A medicine for pain.  A mouth guard may be placed in your mouth to protect your teeth and to keep you from biting on the endoscope.  You will be asked to lie on your left side.  The endoscope will be lowered down your throat into your esophagus, stomach, and duodenum.  Air will be put into the endoscope.  This will help your health care provider see better.  The lining of your esophagus, stomach, and duodenum will be examined.  Your health care provider may: ? Take a tissue sample so it can be looked at in a lab (biopsy). ? Remove growths. ? Remove objects (foreign bodies) that are stuck. ? Treat any bleeding with medicines or other devices that stop tissue from bleeding. ? Widen (dilate) or stretch narrowed areas of your esophagus and stomach.  The endoscope will be taken out. The procedure may vary among health care providers and hospitals. What happens after the procedure?  Your blood pressure, heart rate, breathing rate, and blood oxygen level will be monitored often until the medicines you were given have worn off.  Do not eat or drink anything until the numbing medicine has worn off and your gag reflex has returned. This information is not intended to replace advice given to you by your health care provider. Make sure you discuss any questions you have with your health care provider. Document Released: 05/16/2004 Document Revised: 06/21/2015 Document Reviewed: 12/07/2014 Elsevier Interactive Patient Education  2018 [employer]. Esophagogastroduodenoscopy, Care After Refer to this sheet in the next few weeks. These instructions provide you with information about caring for yourself after your procedure. Your health care provider may also give you more specific instructions. Your treatment has been planned according to current medical practices, but problems sometimes occur. Call your health care provider if you have any problems or questions after your procedure. What can I expect after the procedure? After the procedure, it is common to have:  A sore throat.  Nausea.  Bloating.  Dizziness.  Fatigue.  Follow these instructions at home:  Do not eat or drink anything until the numbing medicine (local anesthetic) has worn off and your gag reflex has returned. You will know  that the local anesthetic has worn off when you can swallow comfortably.  Do not drive for 24 hours if you received a medicine to help you relax (sedative).  If your health care provider took a tissue sample for testing during the procedure, make sure to get your test results. This is your responsibility. Ask your health care provider or the department performing the test when your results will be ready.  Keep all follow-up visits as told by your health care provider. This is important. Contact a health care provider if:  You cannot stop coughing.  You are not urinating.  You are urinating less than usual. Get help right away if:  You have trouble swallowing.  You cannot eat or drink.  You have throat or chest pain that gets worse.  You are dizzy or light-headed.  You faint.  You have nausea or vomiting.  You have chills.  You have a fever.  You have severe abdominal pain.  You have black, tarry, or bloody stools. This information is not intended to replace advice given to you by your health care provider. Make  sure you discuss any questions you have with your health care provider. Document Released: 12/31/2011 Document Revised: 06/21/2015 Document Reviewed: 12/07/2014 Elsevier Interactive Patient Education  2018 [employer].  Colonoscopy, Adult A colonoscopy is an exam to look at the large intestine. It is done to check for problems, such as:  Lumps (tumors).  Growths (polyps).  Swelling (inflammation).  Bleeding.  What happens before the procedure? Eating and drinking Follow instructions from your doctor about eating and drinking. These instructions may include:  A few days before the procedure - follow a low-fiber diet. ? Avoid nuts. ? Avoid seeds. ? Avoid dried fruit. ? Avoid raw fruits. ? Avoid vegetables.  1-3 days before the procedure - follow a clear liquid diet. Avoid liquids that have red or purple dye. Drink only clear liquids, such  as: ? Clear broth or bouillon. ? Black coffee or tea. ? Clear juice. ? Clear soft drinks or sports drinks. ? Gelatin dessert. ? Popsicles.  On the day of the procedure - do not eat or drink anything during the 2 hours before the procedure.  Bowel prep If you were prescribed an oral bowel prep:  Take it as told by your doctor. Starting the day before your procedure, you will need to drink a lot of liquid. The liquid will cause you to poop (have bowel movements) until your poop is almost clear or light green.  If your skin or butt gets irritated from diarrhea, you may: ? Wipe the area with wipes that have medicine in them, such as adult wet wipes with aloe and vitamin E. ? Put something on your skin that soothes the area, such as petroleum jelly.  If you throw up (vomit) while drinking the bowel prep, take a break for up to 60 minutes. Then begin the bowel prep again. If you keep throwing up and you cannot take the bowel prep without throwing up, call your doctor.  General instructions  Ask your doctor about changing or stopping your normal medicines. This is important if you take diabetes medicines or blood thinners.  Plan to have someone take you home from the hospital or clinic. What happens during the procedure?  An IV tube may be put into one of your veins.  You will be given medicine to help you relax (sedative).  To reduce your risk of infection: ? Your doctors will wash their hands. ? Your anal area will be washed with soap.  You will be asked to lie on your side with your knees bent.  Your doctor will get a long, thin, flexible tube ready. The tube will have a camera and a light on the end.  The tube will be put into your anus.  The tube will be gently put into your large intestine.  Air will be delivered into your large intestine to keep it open. You may feel some pressure or cramping.  The camera will be used to take photos.  A small tissue sample may be  removed from your body to be looked at under a microscope (biopsy). If any possible problems are found, the tissue will be sent to a lab for testing.  If small growths are found, your doctor may remove them and have them checked for cancer.  The tube that was put into your anus will be slowly removed. The procedure may vary among doctors and hospitals. What happens after the procedure?  Your doctor will check on you often until the medicines you were given have worn  off.  Do not drive for 24 hours after the procedure.  You may have a small amount of blood in your poop.  You may pass gas.  You may have mild cramps or bloating in your belly (abdomen).  It is up to you to get the results of your procedure. Ask your doctor, or the department performing the procedure, when your results will be ready. This information is not intended to replace advice given to you by your health care provider. Make sure you discuss any questions you have with your health care provider. Document Released: 02/15/2010 Document Revised: 11/14/2015 Document Reviewed: 03/27/2015 Elsevier Interactive Patient Education  2017 [employer]  Colonoscopy, Adult, Care After This sheet gives you information about how to care for yourself after your procedure. Your health care provider may also give you more specific instructions. If you have problems or questions, contact your health care provider. What can I expect after the procedure? After the procedure, it is common to have:  A small amount of blood in your stool for 24 hours after the procedure.  Some gas.  Mild abdominal cramping or bloating.  Follow these instructions at home: General instructions   For the first 24 hours after the procedure: ? Do not drive or use machinery. ? Do not sign important documents. ? Do not drink alcohol. ? Do your regular daily activities at a slower pace than normal. ? Eat soft, easy-to-digest foods. ? Rest  often.  Take over-the-counter or prescription medicines only as told by your health care provider.  It is up to you to get the results of your procedure. Ask your health care provider, or the department performing the procedure, when your results will be ready. Relieving cramping and bloating  Try walking around when you have cramps or feel bloated.  Apply heat to your abdomen as told by your health care provider. Use a heat source that your health care provider recommends, such as a moist heat pack or a heating pad. ? Place a towel between your skin and the heat source. ? Leave the heat on for 20-30 minutes. ? Remove the heat if your skin turns bright red. This is especially important if you are unable to feel pain, heat, or cold. You may have a greater risk of getting burned. Eating and drinking  Drink enough fluid to keep your urine clear or pale yellow.  Resume your normal diet as instructed by your health care provider. Avoid heavy or fried foods that are hard to digest.  Avoid drinking alcohol for as long as instructed by your health care provider. Contact a health care provider if:  You have blood in your stool 2-3 days after the procedure. Get help right away if:  You have more than a small spotting of blood in your stool.  You pass large blood clots in your stool.  Your abdomen is swollen.  You have nausea or vomiting.  You have a fever.  You have increasing abdominal pain that is not relieved with medicine. This information is not intended to replace advice given to you by your health care provider. Make sure you discuss any questions you have with your health care provider. Document Released: 08/28/2003 Document Revised: 10/08/2015 Document Reviewed: 03/27/2015 Elsevier Interactive Patient Education  2018 [place] Anesthesia is a term that refers to techniques, procedures, and medicines that help a person stay safe and comfortable  during a medical procedure. Monitored anesthesia care, or sedation, is one  type of anesthesia. Your anesthesia specialist may recommend sedation if you will be having a procedure that does not require you to be unconscious, such as:  Cataract surgery.  A dental procedure.  A biopsy.  A colonoscopy.  During the procedure, you may receive a medicine to help you relax (sedative). There are three levels of sedation:  Mild sedation. At this level, you may feel awake and relaxed. You will be able to follow directions.  Moderate sedation. At this level, you will be sleepy. You may not remember the procedure.  Deep sedation. At this level, you will be asleep. You will not remember the procedure.  The more medicine you are given, the deeper your level of sedation will be. Depending on how you respond to the procedure, the anesthesia specialist may change your level of sedation or the type of anesthesia to fit your needs. An anesthesia specialist will monitor you closely during the procedure. Let your health care provider know about:  Any allergies you have.  All medicines you are taking, including vitamins, herbs, eye drops, creams, and over-the-counter medicines.  Any use of steroids (by mouth or as a cream).  Any problems you or family members have had with sedatives and anesthetic medicines.  Any blood disorders you have.  Any surgeries you have had.  Any medical conditions you have, such as sleep apnea.  Whether you are pregnant or may be pregnant.  Any use of cigarettes, alcohol, or street drugs. What are the risks? Generally, this is a safe procedure. However, problems may occur, including:  Getting too much medicine (oversedation).  Nausea.  Allergic reaction to medicines.  Trouble breathing. If this happens, a breathing tube may be used to help with breathing. It will be removed when you are awake and breathing on your own.  Heart trouble.  Lung trouble.  Before  the procedure Staying hydrated Follow instructions from your health care provider about hydration, which may include:  Up to 2 hours before the procedure - you may continue to drink clear liquids, such as water, clear fruit juice, black coffee, and plain tea.  Eating and drinking restrictions Follow instructions from your health care provider about eating and drinking, which may include:  8 hours before the procedure - stop eating heavy meals or foods such as meat, fried foods, or fatty foods.  6 hours before the procedure - stop eating light meals or foods, such as toast or cereal.  6 hours before the procedure - stop drinking milk or drinks that contain milk.  2 hours before the procedure - stop drinking clear liquids.  Medicines Ask your health care provider about:  Changing or stopping your regular medicines. This is especially important if you are taking diabetes medicines or blood thinners.  Taking medicines such as aspirin and ibuprofen. These medicines can thin your blood. Do not take these medicines before your procedure if your health care provider instructs you not to.  Tests and exams  You will have a physical exam.  You may have blood tests done to show: ? How well your kidneys and liver are working. ? How well your blood can clot.  General instructions  Plan to have someone take you home from the hospital or clinic.  If you will be going home right after the procedure, plan to have someone with you for 24 hours.  What happens during the procedure?  Your blood pressure, heart rate, breathing, level of pain and overall condition will be monitored.  An IV tube will be inserted into one of your veins.  Your anesthesia specialist will give you medicines as needed to keep you comfortable during the procedure. This may mean changing the level of sedation.  The procedure will be performed. After the procedure  Your blood pressure, heart rate, breathing rate, and  blood oxygen level will be monitored until the medicines you were given have worn off.  Do not drive for 24 hours if you received a sedative.  You may: ? Feel sleepy, clumsy, or nauseous. ? Feel forgetful about what happened after the procedure. ? Have a sore throat if you had a breathing tube during the procedure. ? Vomit. This information is not intended to replace advice given to you by your health care provider. Make sure you discuss any questions you have with your health care provider. Document Released: 10/09/2004 Document Revised: 06/22/2015 Document Reviewed: 05/06/2015 Elsevier Interactive Patient Education  2018 [place], Care After These instructions provide you with information about caring for yourself after your procedure. Your health care provider may also give you more specific instructions. Your treatment has been planned according to current medical practices, but problems sometimes occur. Call your health care provider if you have any problems or questions after your procedure. What can I expect after the procedure? After your procedure, it is common to:  Feel sleepy for several hours.  Feel clumsy and have poor balance for several hours.  Feel forgetful about what happened after the procedure.  Have poor judgment for several hours.  Feel nauseous or vomit.  Have a sore throat if you had a breathing tube during the procedure.  Follow these instructions at home: For at least 24 hours after the procedure:   Do not: ? Participate in activities in which you could fall or become injured. ? Drive. ? Use heavy machinery. ? Drink alcohol. ? Take sleeping pills or medicines that cause drowsiness. ? Make important decisions or sign legal documents. ? Take care of children on your own.  Rest. Eating and drinking  Follow the diet that is recommended by your health care provider.  If you vomit, drink water, juice, or soup when you  can drink without vomiting.  Make sure you have little or no nausea before eating solid foods. General instructions  Have a responsible adult stay with you until you are awake and alert.  Take over-the-counter and prescription medicines only as told by your health care provider.  If you smoke, do not smoke without supervision.  Keep all follow-up visits as told by your health care provider. This is important. Contact a health care provider if:  You keep feeling nauseous or you keep vomiting.  You feel light-headed.  You develop a rash.  You have a fever. Get help right away if:  You have trouble breathing. This information is not intended to replace advice given to you by your health care provider. Make sure you discuss any questions you have with your health care provider. Document Released: 05/06/2015 Document Revised: 09/05/2015 Document Reviewed: 05/06/2015 Elsevier Interactive Patient Education  [employer].

## 2017-06-10 ENCOUNTER — Encounter (HOSPITAL_COMMUNITY): Payer: Self-pay

## 2017-06-10 ENCOUNTER — Other Ambulatory Visit: Payer: Self-pay

## 2017-06-10 ENCOUNTER — Encounter (HOSPITAL_COMMUNITY)
Admission: RE | Admit: 2017-06-10 | Discharge: 2017-06-10 | Disposition: A | Discharge status: Home / Self Care | Payer: Medicare Other | Source: Ambulatory Visit | Attending: Internal Medicine | Admitting: Internal Medicine

## 2017-06-10 DIAGNOSIS — I4510 Unspecified right bundle-branch block: Secondary | ICD-10-CM | POA: Insufficient documentation

## 2017-06-10 DIAGNOSIS — K449 Diaphragmatic hernia without obstruction or gangrene: Secondary | ICD-10-CM | POA: Diagnosis not present

## 2017-06-10 DIAGNOSIS — Z01810 Encounter for preprocedural cardiovascular examination: Secondary | ICD-10-CM | POA: Insufficient documentation

## 2017-06-10 DIAGNOSIS — K5730 Diverticulosis of large intestine without perforation or abscess without bleeding: Secondary | ICD-10-CM | POA: Diagnosis not present

## 2017-06-10 LAB — CBC
HEMATOCRIT: 32.8 % — AB (ref 39.0–52.0)
Hemoglobin: 11 g/dL — ABNORMAL LOW (ref 13.0–17.0)
MCH: 29.2 pg (ref 26.0–34.0)
MCHC: 33.5 g/dL (ref 30.0–36.0)
MCV: 87 fL (ref 78.0–100.0)
Platelets: 215 10*3/uL (ref 150–400)
RBC: 3.77 MIL/uL — ABNORMAL LOW (ref 4.22–5.81)
RDW: 11.9 % (ref 11.5–15.5)
WBC: 8.2 10*3/uL (ref 4.0–10.5)

## 2017-06-10 LAB — BASIC METABOLIC PANEL
ANION GAP: 7 (ref 5–15)
BUN: 40 mg/dL — AB (ref 6–20)
CALCIUM: 9.1 mg/dL (ref 8.9–10.3)
CO2: 23 mmol/L (ref 22–32)
CREATININE: 2.22 mg/dL — AB (ref 0.61–1.24)
Chloride: 105 mmol/L (ref 101–111)
GFR calc Af Amer: 35 mL/min — ABNORMAL LOW (ref >60)
GFR, EST NON AFRICAN AMERICAN: 30 mL/min — AB (ref >60)
GLUCOSE: 117 mg/dL — AB (ref 65–99)
Potassium: 4.9 mmol/L (ref 3.5–5.1)
Sodium: 135 mmol/L (ref 135–145)

## 2017-06-10 NOTE — Pre-Procedure Instructions (Signed)
BUN 40, CR 2.22 called to [facility] and PCP Octavio Graves.

## 2017-06-12 ENCOUNTER — Telehealth: Payer: Self-pay | Admitting: Gastroenterology

## 2017-06-12 NOTE — Telephone Encounter (Signed)
Pt notified of results and will follow up with pcp. Pt is aware that his results were sent to his pcp. Pt will also do his best at staying hydrated.

## 2017-06-12 NOTE — Telephone Encounter (Signed)
Please let patient know that I spoke with Dr. Gala Romney about his worsening creatinine. It was 1.56 2 months ago and now 2.22.  Patient needs to see his PCP within next week for this. Pre-op states they sent PCP labs and notified them by phone but please make sure patient is aware.   ALSO, VERY CRUCIAL IS THAT THE PATIENT DRINKS PLENTY OF FLUIDS BETWEEN NOW AND HIS PROCEDURE ON Monday. HE NEEDS TO STAY HYDRATED AND KEEP URINE LIGHT YELLOW ESPECIALLY GIVEN HE WILL BE DRINKING BOWEL PREP.

## 2017-06-15 ENCOUNTER — Encounter (HOSPITAL_COMMUNITY): Payer: Self-pay | Admitting: *Deleted

## 2017-06-15 ENCOUNTER — Encounter (HOSPITAL_COMMUNITY)
Admission: RE | Disposition: A | Discharge status: Home / Self Care | Payer: Self-pay | Source: Ambulatory Visit | Attending: Internal Medicine

## 2017-06-15 ENCOUNTER — Ambulatory Visit (HOSPITAL_COMMUNITY): Payer: Medicare Other | Admitting: Anesthesiology

## 2017-06-15 ENCOUNTER — Ambulatory Visit (HOSPITAL_COMMUNITY)
Admission: RE | Admit: 2017-06-15 | Discharge: 2017-06-15 | Disposition: A | Discharge status: Home / Self Care | Payer: Medicare Other | Source: Ambulatory Visit | Attending: Internal Medicine | Admitting: Internal Medicine

## 2017-06-15 ENCOUNTER — Other Ambulatory Visit: Payer: Self-pay

## 2017-06-15 DIAGNOSIS — I10 Essential (primary) hypertension: Secondary | ICD-10-CM | POA: Insufficient documentation

## 2017-06-15 DIAGNOSIS — Z902 Acquired absence of lung [part of]: Secondary | ICD-10-CM | POA: Insufficient documentation

## 2017-06-15 DIAGNOSIS — M545 Low back pain: Secondary | ICD-10-CM | POA: Insufficient documentation

## 2017-06-15 DIAGNOSIS — E039 Hypothyroidism, unspecified: Secondary | ICD-10-CM | POA: Diagnosis not present

## 2017-06-15 DIAGNOSIS — Z880 Allergy status to penicillin: Secondary | ICD-10-CM | POA: Diagnosis not present

## 2017-06-15 DIAGNOSIS — D509 Iron deficiency anemia, unspecified: Secondary | ICD-10-CM | POA: Insufficient documentation

## 2017-06-15 DIAGNOSIS — D649 Anemia, unspecified: Secondary | ICD-10-CM

## 2017-06-15 DIAGNOSIS — Z79899 Other long term (current) drug therapy: Secondary | ICD-10-CM | POA: Insufficient documentation

## 2017-06-15 DIAGNOSIS — K449 Diaphragmatic hernia without obstruction or gangrene: Secondary | ICD-10-CM | POA: Insufficient documentation

## 2017-06-15 DIAGNOSIS — K5909 Other constipation: Secondary | ICD-10-CM | POA: Insufficient documentation

## 2017-06-15 DIAGNOSIS — K5730 Diverticulosis of large intestine without perforation or abscess without bleeding: Secondary | ICD-10-CM | POA: Diagnosis not present

## 2017-06-15 DIAGNOSIS — E782 Mixed hyperlipidemia: Secondary | ICD-10-CM | POA: Insufficient documentation

## 2017-06-15 DIAGNOSIS — Z13810 Encounter for screening for upper gastrointestinal disorder: Secondary | ICD-10-CM

## 2017-06-15 DIAGNOSIS — B182 Chronic viral hepatitis C: Secondary | ICD-10-CM | POA: Diagnosis not present

## 2017-06-15 DIAGNOSIS — K7460 Unspecified cirrhosis of liver: Secondary | ICD-10-CM

## 2017-06-15 HISTORY — PX: ESOPHAGOGASTRODUODENOSCOPY (EGD) WITH PROPOFOL: SHX5813

## 2017-06-15 HISTORY — PX: COLONOSCOPY WITH PROPOFOL: SHX5780

## 2017-06-15 SURGERY — COLONOSCOPY WITH PROPOFOL
Anesthesia: Monitor Anesthesia Care

## 2017-06-15 MED ORDER — PROPOFOL 10 MG/ML IV BOLUS
INTRAVENOUS | Status: DC | PRN
  Administered 2017-06-15 (×5): 50 mg via INTRAVENOUS

## 2017-06-15 MED ORDER — PROPOFOL 500 MG/50ML IV EMUL: INTRAVENOUS | Status: DC | PRN

## 2017-06-15 MED ORDER — LIDOCAINE HCL (PF) 1 % IJ SOLN
INTRAMUSCULAR | Status: AC
  Filled 2017-06-15: qty 5

## 2017-06-15 MED ORDER — LACTATED RINGERS IV SOLN
INTRAVENOUS | Status: DC
  Administered 2017-06-15: 08:00:00 via INTRAVENOUS

## 2017-06-15 MED ORDER — LIDOCAINE HCL (CARDIAC) PF 50 MG/5ML IV SOSY
PREFILLED_SYRINGE | INTRAVENOUS | Status: DC | PRN
  Administered 2017-06-15: 25 mg via INTRAVENOUS

## 2017-06-15 MED ORDER — GLYCOPYRROLATE 0.2 MG/ML IJ SOLN
INTRAMUSCULAR | Status: DC | PRN
  Administered 2017-06-15: 0.2 mg via INTRAVENOUS

## 2017-06-15 MED ORDER — CHLORHEXIDINE GLUCONATE CLOTH 2 % EX PADS: 6.0000 | MEDICATED_PAD | Freq: Once | CUTANEOUS | Status: DC

## 2017-06-15 MED ORDER — PROPOFOL 10 MG/ML IV BOLUS
INTRAVENOUS | Status: AC
  Filled 2017-06-15: qty 60

## 2017-06-15 MED ORDER — PROPOFOL 10 MG/ML IV BOLUS
INTRAVENOUS | Status: AC
  Filled 2017-06-15: qty 20

## 2017-06-15 NOTE — Op Note (Signed)
[facility] Patient Name: Colton Gibbs Procedure Date: 06/15/2017 8:34 AM MRN: [id number] Date of Birth: 05/19/1955 Attending MD: Norvel Richards , MD CSN: [id number] Age: 62 Admit Type: Outpatient Procedure:                Upper GI endoscopy Indications:              Screening procedure Providers:                Norvel Richards, MD, Jeanann Lewandowsky. Sharon Seller, RN,                            Randa Spike, Technician Referring MD:             Octavio Graves MD, MD Medicines:                Propofol per Anesthesia Complications:            No immediate complications. Estimated Blood Loss:     Estimated blood loss: none. Procedure:                Pre-Anesthesia Assessment:                           - Prior to the procedure, a History and Physical                            was performed, and patient medications and                            allergies were reviewed. The patient's tolerance of                            previous anesthesia was also reviewed. The risks                            and benefits of the procedure and the sedation                            options and risks were discussed with the patient.                            All questions were answered, and informed consent                            was obtained. Prior Anticoagulants: The patient has                            taken no previous anticoagulant or antiplatelet                            agents. ASA Grade Assessment: III - A patient with                            severe systemic disease. After reviewing the risks  and benefits, the patient was deemed in                            satisfactory condition to undergo the procedure.                           After obtaining informed consent, the endoscope was                            passed under direct vision. Throughout the                            procedure, the patient's blood pressure, pulse, and         oxygen saturations were monitored continuously. The                            [id number] ([id number]) scope was introduced through the                            and advanced to the second part of duodenum. The                            upper GI endoscopy was accomplished without                            difficulty. The patient tolerated the procedure                            well. Scope In: 8:46:04 AM Scope Out: 8:48:54 AM Total Procedure Duration: 0 hours 2 minutes 50 seconds  Findings:      The examined esophagus was normal.      A small hiatal hernia was present.      No other significant abnormalities were identified in a careful       examination of the stomach.      The duodenal bulb and second portion of the duodenum were normal. Impression:               - Normal esophagus.                           - Small hiatal hernia.                           - Normal duodenal bulb and second portion of the                            duodenum.                           - No specimens collected. Moderate Sedation:      Moderate (conscious) sedation was personally administered by an       anesthesia professional. The following parameters were monitored: oxygen       saturation, heart rate, blood pressure, respiratory rate, EKG, adequacy       of pulmonary ventilation, and response to care. Total physician  intraservice time was 8 minutes. Recommendation:           - Patient has a contact number available for                            emergencies. The signs and symptoms of potential                            delayed complications were discussed with the                            patient. Return to normal activities tomorrow.                            Written discharge instructions were provided to the                            patient.                           - Resume previous diet.                           - Continue present medications.                           -  Repeat upper endoscopy (date not yet determined)                            for screening purposes.                           - Return to GI clinic in 3 months. Procedure Code(s):        --- Professional ---                           [phone], Esophagogastroduodenoscopy, flexible,                            transoral; diagnostic, including collection of                            specimen(s) by brushing or washing, when performed                            (separate procedure) Diagnosis Code(s):        --- Professional ---                           K44.9, Diaphragmatic hernia without obstruction or                            gangrene                           Z13.810, Encounter for screening for upper  gastrointestinal disorder CPT copyright 2017 [employer]. All rights reserved. The codes documented in this report are preliminary and upon coder review may  be revised to meet current compliance requirements. Cristopher Estimable. Teola Felipe, MD Norvel Richards, MD 06/15/2017 8:53:54 AM This report has been signed electronically. Number of Addenda: 0
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
## 2017-06-15 NOTE — Op Note (Signed)
[facility] Patient Name: Colton Gibbs Procedure Date: 06/15/2017 8:51 AM MRN: [id number] Date of Birth: Jan 22, 1956 Attending MD: Norvel Richards , MD CSN: [id number] Age: 62 Admit Type: Outpatient Procedure:                Colonoscopy Indications:              Iron deficiency anemia Providers:                Norvel Richards, MD, Gwenlyn Fudge RN, RN,                            Randa Spike, Technician Referring MD:              Medicines:                Propofol per Anesthesia Complications:            No immediate complications. Estimated Blood Loss:     Estimated blood loss: none. Procedure:                Pre-Anesthesia Assessment:                           - Prior to the procedure, a History and Physical                            was performed, and patient medications and                            allergies were reviewed. The patient's tolerance of                            previous anesthesia was also reviewed. The risks                            and benefits of the procedure and the sedation                            options and risks were discussed with the patient.                            All questions were answered, and informed consent                            was obtained. Prior Anticoagulants: The patient has                            taken no previous anticoagulant or antiplatelet                            agents. ASA Grade Assessment: III - A patient with                            severe systemic disease. After reviewing the risks  and benefits, the patient was deemed in                            satisfactory condition to undergo the procedure.                           After obtaining informed consent, the colonoscope                            was passed under direct vision. Throughout the                            procedure, the patient's blood pressure, pulse, and                            oxygen saturations  were monitored continuously. The                            [id number] ([id number]) scope was introduced through                            the and advanced to the the cecum, identified by                            appendiceal orifice and ileocecal valve. The                            colonoscopy was performed without difficulty. The                            patient tolerated the procedure well. The quality                            of the bowel preparation was adequate. The                            ileocecal valve, appendiceal orifice, and rectum                            were photographed. The entire colon was well                            visualized. Scope In: 8:55:34 AM Scope Out: 9:10:47 AM Scope Withdrawal Time: 0 hours 11 minutes 40 seconds  Total Procedure Duration: 0 hours 15 minutes 13 seconds  Findings:      The perianal and digital rectal examinations were normal.      A few small-mouthed diverticula were found in the entire colon.      The exam was otherwise without abnormality on direct and retroflexion       views. Impression:               - Diverticulosis in the entire examined colon.                           -  The examination was otherwise normal on direct                            and retroflexion views.                           - No specimens collected. Moderate Sedation:      Moderate (conscious) sedation was administered by the endoscopy nurse       and supervised by the endoscopist. The following parameters were       monitored: oxygen saturation, heart rate, blood pressure, respiratory       rate, EKG, adequacy of pulmonary ventilation, and response to care.       Total physician intraservice time was 29 minutes. Recommendation:           - Patient has a contact number available for                            emergencies. The signs and symptoms of potential                            delayed complications were discussed with the                             patient. Return to normal activities tomorrow.                            Written discharge instructions were provided to the                            patient.                           - Advance diet as tolerated.                           - Continue present medications.                           - Repeat colonoscopy in 10 years for screening                            purposes.                           - Return to GI office in 3 months. Procedure Code(s):        --- Professional ---                           [phone], Colonoscopy, flexible; diagnostic, including                            collection of specimen(s) by brushing or washing,                            when performed (separate procedure)  G0500, Moderate sedation services provided by the                            same physician or other qualified health care                            professional performing a gastrointestinal                            endoscopic service that sedation supports,                            requiring the presence of an independent trained                            observer to assist in the monitoring of the                            patient's level of consciousness and physiological                            status; initial 15 minutes of intra-service time;                            patient age 50 years or older (additional time may                            be reported with [phone], as appropriate)                           [phone], Moderate sedation services provided by the                            same physician or other qualified health care                            professional performing the diagnostic or                            therapeutic service that the sedation supports,                            requiring the presence of an independent trained                            observer to assist in the monitoring of the                             patient's level of consciousness and physiological                            status; each additional 15 minutes intraservice  time (List separately in addition to code for                            primary service) Diagnosis Code(s):        --- Professional ---                           D50.9, Iron deficiency anemia, unspecified                           K57.30, Diverticulosis of large intestine without                            perforation or abscess without bleeding CPT copyright 2017 [employer] Association. All rights reserved. The codes documented in this report are preliminary and upon coder review may  be revised to meet current compliance requirements. Cristopher Estimable. Rourk, MD Norvel Richards, MD 06/15/2017 9:16:12 AM This report has been signed electronically. Number of Addenda: 0

## 2017-06-15 NOTE — Transfer of Care (Signed)
Immediate Anesthesia Transfer of Care Note  Patient: Colton Gibbs  Procedure(s) Performed: COLONOSCOPY WITH PROPOFOL (N/A ) ESOPHAGOGASTRODUODENOSCOPY (EGD) WITH PROPOFOL (N/A )  Patient Location: PACU  Anesthesia Type:MAC  Level of Consciousness: awake, alert , oriented and patient cooperative  Airway & Oxygen Therapy: Patient Spontanous Breathing  Post-op Assessment: Report given to RN and Post -op Vital signs reviewed and stable  Post vital signs: Reviewed and stable  Last Vitals:  Vitals Value Taken Time  BP    Temp    Pulse 47 06/15/2017  9:18 AM  Resp 15 06/15/2017  9:18 AM  SpO2 81 % 06/15/2017  9:18 AM  Vitals shown include unvalidated device data.  Last Pain:  Vitals:   06/15/17 0838  TempSrc:   PainSc: 9       Patients Stated Pain Goal: 9 (09/81/19 1478)  Complications: No apparent anesthesia complications

## 2017-06-15 NOTE — Discharge Instructions (Signed)
°Colonoscopy °Discharge Instructions ° °Read the instructions outlined below and refer to this sheet in the next few weeks. These discharge instructions provide you with general information on caring for yourself after you leave the hospital. Your doctor may also give you specific instructions. While your treatment has been planned according to the most current medical practices available, unavoidable complications occasionally occur. If you have any problems or questions after discharge, call Dr. Rourk at [phone]. °ACTIVITY °· You may resume your regular activity, but move at a slower pace for the next 24 hours.  °· Take frequent rest periods for the next 24 hours.  °· Walking will help get rid of the air and reduce the bloated feeling in your belly (abdomen).  °· No driving for 24 hours (because of the medicine (anesthesia) used during the test).   °· Do not sign any important legal documents or operate any machinery for 24 hours (because of the anesthesia used during the test).  °NUTRITION °· Drink plenty of fluids.  °· You may resume your normal diet as instructed by your doctor.  °· Begin with a light meal and progress to your normal diet. Heavy or fried foods are harder to digest and may make you feel sick to your stomach (nauseated).  °· Avoid alcoholic beverages for 24 hours or as instructed.  °MEDICATIONS °· You may resume your normal medications unless your doctor tells you otherwise.  °WHAT YOU CAN EXPECT TODAY °· Some feelings of bloating in the abdomen.  °· Passage of more gas than usual.  °· Spotting of blood in your stool or on the toilet paper.  °IF YOU HAD POLYPS REMOVED DURING THE COLONOSCOPY: °· No aspirin products for 7 days or as instructed.  °· No alcohol for 7 days or as instructed.  °· Eat a soft diet for the next 24 hours.  °FINDING OUT THE RESULTS OF YOUR TEST °Not all test results are available during your visit. If your test results are not back during the visit, make an appointment  with your caregiver to find out the results. Do not assume everything is normal if you have not heard from your caregiver or the medical facility. It is important for you to follow up on all of your test results.  °SEEK IMMEDIATE MEDICAL ATTENTION IF: °· You have more than a spotting of blood in your stool.  °· Your belly is swollen (abdominal distention).  °· You are nauseated or vomiting.  °· You have a temperature over 101.  °· You have abdominal pain or discomfort that is severe or gets worse throughout the day.  °EGD °Discharge instructions °Please read the instructions outlined below and refer to this sheet in the next few weeks. These discharge instructions provide you with general information on caring for yourself after you leave the hospital. Your doctor may also give you specific instructions. While your treatment has been planned according to the most current medical practices available, unavoidable complications occasionally occur. If you have any problems or questions after discharge, please call your doctor. °ACTIVITY °· You may resume your regular activity but move at a slower pace for the next 24 hours.  °· Take frequent rest periods for the next 24 hours.  °· Walking will help expel (get rid of) the air and reduce the bloated feeling in your abdomen.  °· No driving for 24 hours (because of the anesthesia (medicine) used during the test).  °· You may shower.  °· Do not sign any important   legal documents or operate any machinery for 24 hours (because of the anesthesia used during the test).  NUTRITION  Drink plenty of fluids.   You may resume your normal diet.   Begin with a light meal and progress to your normal diet.   Avoid alcoholic beverages for 24 hours or as instructed by your caregiver.  MEDICATIONS  You may resume your normal medications unless your caregiver tells you otherwise.  WHAT YOU CAN EXPECT TODAY  You may experience abdominal discomfort such as a feeling of fullness  or gas pains.  FOLLOW-UP  Your doctor will discuss the results of your test with you.  SEEK IMMEDIATE MEDICAL ATTENTION IF ANY OF THE FOLLOWING OCCUR:  Excessive nausea (feeling sick to your stomach) and/or vomiting.   Severe abdominal pain and distention (swelling).   Trouble swallowing.   Temperature over 101 F (37.8 C).   Rectal bleeding or vomiting of blood.    Diverticulosis information provided  Repeat screening colonoscopy in 10 years  Office visit with [place] in 3 months.      Diverticulosis Diverticulosis is a condition that develops when small pouches (diverticula) form in the wall of the large intestine (colon). The colon is where water is absorbed and stool is formed. The pouches form when the inside layer of the colon pushes through weak spots in the outer layers of the colon. You may have a few pouches or many of them. What are the causes? The cause of this condition is not known. What increases the risk? The following factors may make you more likely to develop this condition:  Being older than age 20. Your risk for this condition increases with age. Diverticulosis is rare among people younger than age 18. By age 65, many people have it.  Eating a low-fiber diet.  Having frequent constipation.  Being overweight.  Not getting enough exercise.  Smoking.  Taking over-the-counter pain medicines, like aspirin and ibuprofen.  Having a family history of diverticulosis.  What are the signs or symptoms? In most people, there are no symptoms of this condition. If you do have symptoms, they may include:  Bloating.  Cramps in the abdomen.  Constipation or diarrhea.  Pain in the lower left side of the abdomen.  How is this diagnosed? This condition is most often diagnosed during an exam for other colon problems. Because diverticulosis usually has no symptoms, it often cannot be diagnosed independently. This condition may be diagnosed by:  Using a  flexible scope to examine the colon (colonoscopy).  Taking an X-ray of the colon after dye has been put into the colon (barium enema).  Doing a CT scan.  How is this treated? You may not need treatment for this condition if you have never developed an infection related to diverticulosis. If you have had an infection before, treatment may include:  Eating a high-fiber diet. This may include eating more fruits, vegetables, and grains.  Taking a fiber supplement.  Taking a live bacteria supplement (probiotic).  Taking medicine to relax your colon.  Taking antibiotic medicines.  Follow these instructions at home:  Drink 6-8 glasses of water or more each day to prevent constipation.  Try not to strain when you have a bowel movement.  If you have had an infection before: ? Eat more fiber as directed by your health care provider or your diet and nutrition specialist (dietitian). ? Take a fiber supplement or probiotic, if your health care provider approves.  Take over-the-counter and prescription medicines  only as told by your health care provider.  If you were prescribed an antibiotic, take it as told by your health care provider. Do not stop taking the antibiotic even if you start to feel better.  Keep all follow-up visits as told by your health care provider. This is important. Contact a health care provider if:  You have pain in your abdomen.  You have bloating.  You have cramps.  You have not had a bowel movement in 3 days. Get help right away if:  Your pain gets worse.  Your bloating becomes very bad.  You have a fever or chills, and your symptoms suddenly get worse.  You vomit.  You have bowel movements that are bloody or black.  You have bleeding from your rectum. Summary  Diverticulosis is a condition that develops when small pouches (diverticula) form in the wall of the large intestine (colon).  You may have a few pouches or many of them.  This  condition is most often diagnosed during an exam for other colon problems.  If you have had an infection related to diverticulosis, treatment may include increasing the fiber in your diet, taking supplements, or taking medicines. This information is not intended to replace advice given to you by your health care provider. Make sure you discuss any questions you have with your health care provider. Document Released: 10/11/2003 Document Revised: 12/03/2015 Document Reviewed: 12/03/2015 Elsevier Interactive Patient Education  2017 [employer]   PATIENT INSTRUCTIONS POST-ANESTHESIA  IMMEDIATELY FOLLOWING SURGERY:  Do not drive or operate machinery for the first twenty four hours after surgery.  Do not make any important decisions for twenty four hours after surgery or while taking narcotic pain medications or sedatives.  If you develop intractable nausea and vomiting or a severe headache please notify your doctor immediately.  FOLLOW-UP:  Please make an appointment with your surgeon as instructed. You do not need to follow up with anesthesia unless specifically instructed to do so.  WOUND CARE INSTRUCTIONS (if applicable):  Keep a dry clean dressing on the anesthesia/puncture wound site if there is drainage.  Once the wound has quit draining you may leave it open to air.  Generally you should leave the bandage intact for twenty four hours unless there is drainage.  If the epidural site drains for more than 36-48 hours please call the anesthesia department.  QUESTIONS?:  Please feel free to call your physician or the hospital operator if you have any questions, and they will be happy to assist you.

## 2017-06-15 NOTE — OR Nursing (Signed)
Placed earring back in patient's ear

## 2017-06-15 NOTE — Anesthesia Procedure Notes (Signed)
Procedure Name: MAC Date/Time: 06/15/2017 8:40 AM Performed by: Andree Elk Angelin Cutrone A, CRNA Pre-anesthesia Checklist: Patient identified, Emergency Drugs available, Suction available, Patient being monitored and Timeout performed Oxygen Delivery Method: Nasal cannula

## 2017-06-15 NOTE — Anesthesia Postprocedure Evaluation (Signed)
Anesthesia Post Note  Patient: Colton Gibbs  Procedure(s) Performed: COLONOSCOPY WITH PROPOFOL (N/A ) ESOPHAGOGASTRODUODENOSCOPY (EGD) WITH PROPOFOL (N/A )  Patient location during evaluation: PACU Anesthesia Type: MAC Level of consciousness: awake and alert, oriented and patient cooperative Pain management: pain level controlled Vital Signs Assessment: post-procedure vital signs reviewed and stable Respiratory status: spontaneous breathing Cardiovascular status: stable Postop Assessment: no apparent nausea or vomiting Anesthetic complications: no     Last Vitals:  Vitals:   06/15/17 0731  BP: (!) 133/49  Pulse: (!) 53  Resp: 16  Temp: 36.8 C  SpO2: 99%    Last Pain:  Vitals:   06/15/17 0838  TempSrc:   PainSc: 9                  ADAMS, AMY A

## 2017-06-15 NOTE — Anesthesia Preprocedure Evaluation (Signed)
Anesthesia Evaluation  Patient identified by MRN, date of birth, ID band Patient awake    Reviewed: Allergy & Precautions, H&P , NPO status , Patient's Chart, lab work & pertinent test results  Airway Mallampati: II  TM Distance: >3 FB Neck ROM: full    Dental  (+) Edentulous Upper, Edentulous Lower   Pulmonary neg pulmonary ROS,    Pulmonary exam normal breath sounds clear to auscultation       Cardiovascular Exercise Tolerance: Good hypertension, negative cardio ROS   Rhythm:regular Rate:Normal     Neuro/Psych negative neurological ROS  negative psych ROS   GI/Hepatic negative GI ROS, Neg liver ROS, (+) Cirrhosis       , Hepatitis -, C  Endo/Other  negative endocrine ROSHypothyroidism   Renal/GU negative Renal ROS  negative genitourinary   Musculoskeletal   Abdominal   Peds  Hematology negative hematology ROS (+) anemia ,   Anesthesia Other Findings   Reproductive/Obstetrics negative OB ROS                             Anesthesia Physical Anesthesia Plan  ASA: III  Anesthesia Plan: MAC   Post-op Pain Management:    Induction:   PONV Risk Score and Plan:   Airway Management Planned:   Additional Equipment:   Intra-op Plan:   Post-operative Plan:   Informed Consent: I have reviewed the patients History and Physical, chart, labs and discussed the procedure including the risks, benefits and alternatives for the proposed anesthesia with the patient or authorized representative who has indicated his/her understanding and acceptance.   Dental Advisory Given  Plan Discussed with: CRNA  Anesthesia Plan Comments:         Anesthesia Quick Evaluation

## 2017-06-30 ENCOUNTER — Encounter (HOSPITAL_COMMUNITY): Payer: Self-pay | Admitting: Internal Medicine

## 2017-09-07 ENCOUNTER — Telehealth: Payer: Self-pay | Admitting: Internal Medicine

## 2017-09-07 NOTE — Telephone Encounter (Signed)
Letter mailed

## 2017-09-07 NOTE — Telephone Encounter (Signed)
RECALL FOR ULTRASOUND 

## 2017-09-08 ENCOUNTER — Other Ambulatory Visit: Payer: Self-pay

## 2017-09-08 DIAGNOSIS — D649 Anemia, unspecified: Secondary | ICD-10-CM

## 2017-09-15 ENCOUNTER — Telehealth: Payer: Self-pay

## 2017-09-15 DIAGNOSIS — K7460 Unspecified cirrhosis of liver: Secondary | ICD-10-CM

## 2017-09-15 DIAGNOSIS — Z8619 Personal history of other infectious and parasitic diseases: Secondary | ICD-10-CM

## 2017-09-15 NOTE — Telephone Encounter (Signed)
[place] RUQ scheduled for 10/05/17 at 9:30am, arrive at 9:15am. NPO after midnight prior to test. Called pt back and informed him of appt. Letter mailed.

## 2017-09-15 NOTE — Telephone Encounter (Signed)
Pt called office, he received recall letter for US abdomen RUQ in September.

## 2017-09-21 ENCOUNTER — Telehealth: Payer: Self-pay | Admitting: Gastroenterology

## 2017-09-21 ENCOUNTER — Encounter: Payer: Self-pay | Admitting: Gastroenterology

## 2017-09-21 ENCOUNTER — Ambulatory Visit (INDEPENDENT_AMBULATORY_CARE_PROVIDER_SITE_OTHER): Payer: Medicare Other | Admitting: Gastroenterology

## 2017-09-21 VITALS — BP 103/65 | HR 75 | Temp 96.5°F | Ht 77.0 in | Wt 222.4 lb

## 2017-09-21 DIAGNOSIS — K5900 Constipation, unspecified: Secondary | ICD-10-CM | POA: Diagnosis not present

## 2017-09-21 DIAGNOSIS — D649 Anemia, unspecified: Secondary | ICD-10-CM | POA: Diagnosis not present

## 2017-09-21 DIAGNOSIS — K7460 Unspecified cirrhosis of liver: Secondary | ICD-10-CM

## 2017-09-21 NOTE — Assessment & Plan Note (Signed)
F/u labs in near future.

## 2017-09-21 NOTE — Telephone Encounter (Signed)
Please NIC for EGD in 05/2020 for "consider EGD in 3 years for esophageal variceal screening"

## 2017-09-21 NOTE — Telephone Encounter (Signed)
ON RECALL  °

## 2017-09-21 NOTE — Assessment & Plan Note (Signed)
Continue current regimen. If stops working, he will let me know. See back in six months.

## 2017-09-21 NOTE — Patient Instructions (Signed)
1. Please have your labs and ultrasound done. We will contact you with results as available. 2. Continue Linzess, Miralax, Colace as before. If this regimen stops working, please let me know.  3. Return to the office in six months or call sooner if needed.

## 2017-09-21 NOTE — Assessment & Plan Note (Addendum)
Lifelong hepatoma screening indicated given pretreatment for HCV elastography was F3/F4. Post tx Fibrosure was F1/F2 but given discordance in results, [facility] advises management as if cirrhosis unless patient wants to pursue liver biopsy.  Update labs and u/s as planned.   Consider EGD for esophageal variceal screening in 05/2020.   See back in six months.

## 2017-09-21 NOTE — Progress Notes (Signed)
CC'D TO PCP °

## 2017-09-21 NOTE — Progress Notes (Signed)
Primary Care Physician: Octavio Graves, DO  Primary Gastroenterologist:  Garfield Cornea, MD   Chief Complaint  Patient presents with  . Cirrhosis    HPI: Colton Gibbs is a 62 y.o. male here for follow-up.  History of hepatitis C status post eradication with F3/F4 fibrosis on elastography, anemia, constipation.  Fibrosure testing showed F1/F2 post HCV treatment.  Previously discussed with [facility], advised lifelong hepatoma surveillance versus offering liver biopsy to determine if cirrhosis or high-grade fibrosis present given discordance between elastography and FibroSure.  He had a EGD and colonoscopy back in May to screen for varices as well as evaluate anemia.  He had normal esophagus, normal stomach other than small hiatal hernia, colonic diverticulosis.  Next colonoscopy planned for 10 years.  Never able to get Movantik, Relistor, ?Amitiza. Has been on Linzess 246mcg daily, colace, and miralax. If takes every day, then BM regular. No melena, brbpr. No abdominal pain. No heartburn, n/v. Weight overall stable. Has some fluid in lower extremities at times. Worse at the end of the day. Has worn compression stockings but reports within 2 hours of removing them, the fluid comes back.   Current Outpatient Medications  Medication Sig Dispense Refill  . atorvastatin (LIPITOR) 20 MG tablet Take 20 mg by mouth daily at 6 PM.     . benazepril-hydrochlorthiazide (LOTENSIN HCT) 20-12.5 MG tablet Take 1 tablet by mouth daily.     Marland Kitchen docusate sodium (COLACE) 100 MG capsule Take 100 mg by mouth daily.    Marland Kitchen levothyroxine (SYNTHROID, LEVOTHROID) 150 MCG tablet Take 150 mcg by mouth daily before breakfast.     . linaclotide (LINZESS) 290 MCG CAPS capsule Take 290 mcg by mouth daily before breakfast.    . oxycodone (ROXICODONE) 30 MG immediate release tablet Take 30 mg by mouth 6 (six) times daily.     . polyethylene glycol (MIRALAX / GLYCOLAX) packet Take 17 g by mouth daily.    .  tamsulosin (FLOMAX) 0.4 MG CAPS capsule Take 0.4 mg by mouth daily.     No current facility-administered medications for this visit.     Allergies as of 09/21/2017 - Review Complete 09/21/2017  Allergen Reaction Noted  . Penicillins Other (See Comments) 12/03/2010    ROS:  General: Negative for anorexia, weight loss, fever, chills, fatigue, weakness. ENT: Negative for hoarseness, difficulty swallowing , nasal congestion. CV: Negative for chest pain, angina, palpitations, dyspnea on exertion, peripheral edema.  Respiratory: Negative for dyspnea at rest, dyspnea on exertion, cough, sputum, wheezing.  GI: See history of present illness. GU:  Negative for dysuria, hematuria, urinary incontinence, urinary frequency, nocturnal urination.  Endo: Negative for unusual weight change.    Physical Examination:   BP 103/65   Pulse 75   Temp (!) 96.5 F (35.8 C) (Oral)   Ht 6\' 5"  (1.956 m)   Wt 222 lb 6.4 oz (100.9 kg)   BMI 26.37 kg/m   General: Well-nourished, well-developed in no acute distress.  Eyes: No icterus. Mouth: Oropharyngeal mucosa moist and pink , no lesions erythema or exudate. Lungs: Clear to auscultation bilaterally.  Heart: Regular rate and rhythm, no murmurs rubs or gallops.  Abdomen: Bowel sounds are normal, nontender, nondistended, no hepatosplenomegaly or masses, no abdominal bruits or hernia , no rebound or guarding.   Extremities: Trace lower extremity edema bilaterally. No clubbing or deformities. Neuro: Alert and oriented x 4   Skin: Warm and dry, no jaundice.   Psych: Alert and cooperative,  normal mood and affect.  Labs:  Lab Results  Component Value Date   CREATININE 2.22 (H) 06/10/2017   BUN 40 (H) 06/10/2017   NA 135 06/10/2017   K 4.9 06/10/2017   CL 105 06/10/2017   CO2 23 06/10/2017   Lab Results  Component Value Date   ALT 19 03/24/2017   AST 19 03/24/2017   ALKPHOS 82 03/24/2017   BILITOT 0.4 03/24/2017   Lab Results  Component Value  Date   WBC 8.2 06/10/2017   HGB 11.0 (L) 06/10/2017   HCT 32.8 (L) 06/10/2017   MCV 87.0 06/10/2017   PLT 215 06/10/2017   Lab Results  Component Value Date   INR 1.0 03/24/2017   INR 1.0 11/13/2016   INR 1.0 03/04/2016    Imaging Studies: No results found.

## 2017-09-22 LAB — COMPREHENSIVE METABOLIC PANEL
ALT: 11 IU/L (ref 0–44)
AST: 17 IU/L (ref 0–40)
Albumin/Globulin Ratio: 1.3 (ref 1.2–2.2)
Albumin: 4.3 g/dL (ref 3.6–4.8)
Alkaline Phosphatase: 94 IU/L (ref 39–117)
BUN/Creatinine Ratio: 15 (ref 10–24)
BUN: 25 mg/dL (ref 8–27)
Bilirubin Total: 0.3 mg/dL (ref 0.0–1.2)
CALCIUM: 9.5 mg/dL (ref 8.6–10.2)
CO2: 22 mmol/L (ref 20–29)
Chloride: 108 mmol/L — ABNORMAL HIGH (ref 96–106)
Creatinine, Ser: 1.62 mg/dL — ABNORMAL HIGH (ref 0.76–1.27)
GFR, EST AFRICAN AMERICAN: 51 mL/min/{1.73_m2} — AB (ref >59)
GFR, EST NON AFRICAN AMERICAN: 45 mL/min/{1.73_m2} — AB (ref >59)
GLOBULIN, TOTAL: 3.2 g/dL (ref 1.5–4.5)
Glucose: 104 mg/dL — ABNORMAL HIGH (ref 65–99)
POTASSIUM: 5.1 mmol/L (ref 3.5–5.2)
Sodium: 143 mmol/L (ref 134–144)
TOTAL PROTEIN: 7.5 g/dL (ref 6.0–8.5)

## 2017-09-22 LAB — CBC WITH DIFFERENTIAL/PLATELET
BASOS: 0 %
Basophils Absolute: 0 10*3/uL (ref 0.0–0.2)
EOS (ABSOLUTE): 0.1 10*3/uL (ref 0.0–0.4)
EOS: 1 %
HEMATOCRIT: 34.3 % — AB (ref 37.5–51.0)
Hemoglobin: 11.1 g/dL — ABNORMAL LOW (ref 13.0–17.7)
IMMATURE GRANS (ABS): 0 10*3/uL (ref 0.0–0.1)
IMMATURE GRANULOCYTES: 0 %
LYMPHS: 20 %
Lymphocytes Absolute: 1.3 10*3/uL (ref 0.7–3.1)
MCH: 28.5 pg (ref 26.6–33.0)
MCHC: 32.4 g/dL (ref 31.5–35.7)
MCV: 88 fL (ref 79–97)
MONOCYTES: 7 %
Monocytes Absolute: 0.4 10*3/uL (ref 0.1–0.9)
NEUTROS PCT: 72 %
Neutrophils Absolute: 4.6 10*3/uL (ref 1.4–7.0)
Platelets: 223 10*3/uL (ref 150–450)
RBC: 3.89 x10E6/uL — ABNORMAL LOW (ref 4.14–5.80)
RDW: 11.9 % — ABNORMAL LOW (ref 12.3–15.4)
WBC: 6.4 10*3/uL (ref 3.4–10.8)

## 2017-09-22 LAB — PROTIME-INR
INR: 1 (ref 0.8–1.2)
Prothrombin Time: 10.5 s (ref 9.1–12.0)

## 2017-09-22 LAB — AFP TUMOR MARKER

## 2017-09-24 ENCOUNTER — Other Ambulatory Visit: Payer: Self-pay | Admitting: *Deleted

## 2017-09-24 ENCOUNTER — Other Ambulatory Visit: Payer: Self-pay

## 2017-09-24 DIAGNOSIS — R899 Unspecified abnormal finding in specimens from other organs, systems and tissues: Secondary | ICD-10-CM

## 2017-09-24 DIAGNOSIS — K7460 Unspecified cirrhosis of liver: Secondary | ICD-10-CM

## 2017-09-24 DIAGNOSIS — R188 Other ascites: Secondary | ICD-10-CM

## 2017-10-06 ENCOUNTER — Ambulatory Visit (HOSPITAL_COMMUNITY)
Admission: RE | Admit: 2017-10-06 | Discharge: 2017-10-06 | Disposition: A | Discharge status: Home / Self Care | Payer: Medicare Other | Source: Ambulatory Visit | Attending: Gastroenterology | Admitting: Gastroenterology

## 2017-10-06 DIAGNOSIS — Z8619 Personal history of other infectious and parasitic diseases: Secondary | ICD-10-CM | POA: Diagnosis not present

## 2017-10-06 DIAGNOSIS — K7460 Unspecified cirrhosis of liver: Secondary | ICD-10-CM | POA: Insufficient documentation

## 2018-02-16 ENCOUNTER — Other Ambulatory Visit: Payer: Self-pay

## 2018-02-16 DIAGNOSIS — K7460 Unspecified cirrhosis of liver: Secondary | ICD-10-CM

## 2018-02-16 DIAGNOSIS — R188 Other ascites: Secondary | ICD-10-CM

## 2018-03-10 ENCOUNTER — Telehealth: Payer: Self-pay | Admitting: Internal Medicine

## 2018-03-10 DIAGNOSIS — K7460 Unspecified cirrhosis of liver: Secondary | ICD-10-CM

## 2018-03-10 NOTE — Telephone Encounter (Signed)
Recall mailed 

## 2018-03-10 NOTE — Telephone Encounter (Signed)
RECALL FOR ULTRASOUND 

## 2018-03-16 NOTE — Telephone Encounter (Signed)
Patient stated is ok to schedule his U/S and he can be reached at [phone].

## 2018-03-17 ENCOUNTER — Other Ambulatory Visit: Payer: Self-pay

## 2018-03-17 NOTE — Telephone Encounter (Signed)
[place] abd RUQ scheduled for 04/07/18 at 9:30am, arrive at 9:15am. NPO after midnight before test. Called and informed pt of appt. Letter mailed.

## 2018-03-17 NOTE — Addendum Note (Signed)
Addended by: Hassan Rowan on: 03/17/2018 04:56 PM   Modules accepted: Orders

## 2018-03-23 ENCOUNTER — Encounter: Payer: Self-pay | Admitting: Gastroenterology

## 2018-03-23 ENCOUNTER — Ambulatory Visit (INDEPENDENT_AMBULATORY_CARE_PROVIDER_SITE_OTHER): Payer: Medicare Other | Admitting: Gastroenterology

## 2018-03-23 VITALS — BP 107/65 | HR 70 | Temp 97.0°F | Ht 77.0 in | Wt 231.0 lb

## 2018-03-23 DIAGNOSIS — K7460 Unspecified cirrhosis of liver: Secondary | ICD-10-CM

## 2018-03-23 DIAGNOSIS — Z8619 Personal history of other infectious and parasitic diseases: Secondary | ICD-10-CM

## 2018-03-23 DIAGNOSIS — K5903 Drug induced constipation: Secondary | ICD-10-CM

## 2018-03-23 NOTE — Progress Notes (Signed)
Primary Care Physician: Adaline Sill, NP  Primary Gastroenterologist:  Garfield Cornea, MD   Chief Complaint  Patient presents with  . Cirrhosis    HPI: Colton Gibbs is a 63 y.o. male here for six month follow up.  He has a history of hepatitis C status post eradication with F3/F4 fibrosis on elastography, chronic constipation in the setting of opioid use.Fibrosure testing showed F1/F2 post HCV treatment.  Previously discussed with [facility], advised lifelong hepatoma surveillance versus offering liver biopsy to determine if cirrhosis or high-grade fibrosis present given discordance between elastography and FibroSure.  He had a EGD and colonoscopy back in May 2019 to screen for varices as well as evaluate anemia.  He had normal esophagus, normal stomach other than small hiatal hernia, colonic diverticulosis.  Next colonoscopy planned for 10 years.  As far as constipation he is doing well on current regimen including Linzess 290 mcg daily, MiraLAX about 2-3 times per week.  He generally has a bowel movement every day.  No melena or rectal bleeding.  Denies abdominal pain, heartburn, vomiting.  Overall his weight is been stable.  No longer wearing compression stockings.  Minimal lower extremity edema.  Plans to have his blood work done on Friday.  He prefers to go to [facility].  We have switched over his labs from [place].  He is scheduled next month for ultrasound.    Current Outpatient Medications  Medication Sig Dispense Refill  . atorvastatin (LIPITOR) 20 MG tablet Take 20 mg by mouth daily at 6 PM.     . benazepril-hydrochlorthiazide (LOTENSIN HCT) 20-12.5 MG tablet Take 1 tablet by mouth daily.     Marland Kitchen docusate sodium (COLACE) 100 MG capsule Take 100 mg by mouth daily.    Marland Kitchen levothyroxine (SYNTHROID, LEVOTHROID) 150 MCG tablet Take 150 mcg by mouth daily before breakfast.     . linaclotide (LINZESS) 290 MCG CAPS capsule Take 290 mcg by mouth daily before breakfast.    .  oxycodone (ROXICODONE) 30 MG immediate release tablet Take 30 mg by mouth 6 (six) times daily.     . polyethylene glycol (MIRALAX / GLYCOLAX) packet Take 17 g by mouth daily as needed.     . tamsulosin (FLOMAX) 0.4 MG CAPS capsule Take 0.4 mg by mouth daily.     No current facility-administered medications for this visit.     Allergies as of 03/23/2018 - Review Complete 03/23/2018  Allergen Reaction Noted  . Penicillins Other (See Comments) 12/03/2010    ROS:  General: Negative for anorexia, weight loss, fever, chills, fatigue, weakness. ENT: Negative for hoarseness, difficulty swallowing , nasal congestion. CV: Negative for chest pain, angina, palpitations, dyspnea on exertion, peripheral edema.  Respiratory: Negative for dyspnea at rest, dyspnea on exertion, cough, sputum, wheezing.  GI: See history of present illness. GU:  Negative for dysuria, hematuria, urinary incontinence, urinary frequency, nocturnal urination.  Endo: Negative for unusual weight change.    Physical Examination:   BP 107/65   Pulse 70   Temp (!) 97 F (36.1 C) (Oral)   Ht 6\' 5"  (1.956 m)   Wt 231 lb (104.8 kg)   BMI 27.39 kg/m   General: Well-nourished, well-developed in no acute distress.  Eyes: No icterus. Mouth: Oropharyngeal mucosa moist and pink , no lesions erythema or exudate. Lungs: Clear to auscultation bilaterally.  Heart: Regular rate and rhythm, no murmurs rubs or gallops.  Abdomen: Bowel sounds are normal, nontender, nondistended, no hepatosplenomegaly or  masses, no abdominal bruits or hernia , no rebound or guarding.   Extremities: Trace lower extremity edema. No clubbing or deformities. Neuro: Alert and oriented x 4   Skin: Warm and dry, no jaundice.   Psych: Alert and cooperative, normal mood and affect.

## 2018-03-23 NOTE — Assessment & Plan Note (Signed)
Plans for lifetime hepatoma screening given pretreatment HCV elastography of F3/F4.  Posttreatment fibro-sure was F1/F2 but given discordance and results, [facility] advises management as of cirrhosis unless patient wants to pursue liver biopsy.  Patient prefers ongoing evaluation instead.  He is updating labs this week.  Ultrasound scheduled for next month.  Consider EGD for esophageal variceal screening in May 2022.  We will have him come back to see Dr. Gala Romney next time, 6 months.

## 2018-03-23 NOTE — Patient Instructions (Signed)
Please have your labs done at [facility] (2 story office building across from [facility]).  We will contact you with results as available.  Please have your ultrasound done as scheduled.  We will contact you with results as available.    We will see you back in 6 months.

## 2018-03-23 NOTE — Progress Notes (Signed)
cc'd to pcp 

## 2018-03-23 NOTE — Assessment & Plan Note (Signed)
Continue current regimen.  return to the office in 6 months.

## 2018-03-29 ENCOUNTER — Other Ambulatory Visit: Payer: Self-pay

## 2018-03-29 DIAGNOSIS — K7460 Unspecified cirrhosis of liver: Secondary | ICD-10-CM

## 2018-03-29 LAB — COMPREHENSIVE METABOLIC PANEL
AG RATIO: 1.3 (calc) (ref 1.0–2.5)
ALKALINE PHOSPHATASE (APISO): 74 U/L (ref 35–144)
ALT: 14 U/L (ref 9–46)
AST: 18 U/L (ref 10–35)
Albumin: 4.4 g/dL (ref 3.6–5.1)
BILIRUBIN TOTAL: 0.5 mg/dL (ref 0.2–1.2)
BUN/Creatinine Ratio: 21 (calc) (ref 6–22)
BUN: 32 mg/dL — AB (ref 7–25)
CALCIUM: 10 mg/dL (ref 8.6–10.3)
CO2: 26 mmol/L (ref 20–32)
Chloride: 104 mmol/L (ref 98–110)
Creat: 1.49 mg/dL — ABNORMAL HIGH (ref 0.70–1.25)
Globulin: 3.4 g/dL (calc) (ref 1.9–3.7)
Glucose, Bld: 123 mg/dL (ref 65–139)
Potassium: 5.1 mmol/L (ref 3.5–5.3)
Sodium: 137 mmol/L (ref 135–146)
Total Protein: 7.8 g/dL (ref 6.1–8.1)

## 2018-03-29 LAB — CBC WITH DIFFERENTIAL/PLATELET
ABSOLUTE MONOCYTES: 360 {cells}/uL (ref 200–950)
Basophils Absolute: 20 cells/uL (ref 0–200)
Basophils Relative: 0.4 %
Eosinophils Absolute: 160 cells/uL (ref 15–500)
Eosinophils Relative: 3.2 %
HEMATOCRIT: 35 % — AB (ref 38.5–50.0)
Hemoglobin: 12 g/dL — ABNORMAL LOW (ref 13.2–17.1)
LYMPHS ABS: 1215 {cells}/uL (ref 850–3900)
MCH: 29.6 pg (ref 27.0–33.0)
MCHC: 34.3 g/dL (ref 32.0–36.0)
MCV: 86.4 fL (ref 80.0–100.0)
MPV: 10.3 fL (ref 7.5–12.5)
Monocytes Relative: 7.2 %
NEUTROS PCT: 64.9 %
Neutro Abs: 3245 cells/uL (ref 1500–7800)
Platelets: 203 10*3/uL (ref 140–400)
RBC: 4.05 10*6/uL — AB (ref 4.20–5.80)
RDW: 11.8 % (ref 11.0–15.0)
Total Lymphocyte: 24.3 %
WBC: 5 10*3/uL (ref 3.8–10.8)

## 2018-03-29 LAB — PROTIME-INR
INR: 1
Prothrombin Time: 10.3 s (ref 9.0–11.5)

## 2018-03-29 LAB — AFP TUMOR MARKER: AFP-Tumor Marker: 0.9 ng/mL (ref <6.1)

## 2018-03-29 NOTE — Telephone Encounter (Signed)
Attempted to submit PA for [place] via [employer]. Per [employer] website: NOTE: This [facility] member does not require prior authorization for OUTPATIENT Radiology through [place] or [facility] at this time.

## 2018-04-07 ENCOUNTER — Ambulatory Visit (HOSPITAL_COMMUNITY)
Admission: RE | Admit: 2018-04-07 | Discharge: 2018-04-07 | Disposition: A | Discharge status: Home / Self Care | Payer: Medicare Other | Source: Ambulatory Visit | Attending: Gastroenterology | Admitting: Gastroenterology

## 2018-04-07 ENCOUNTER — Other Ambulatory Visit: Payer: Self-pay

## 2018-04-07 DIAGNOSIS — K7460 Unspecified cirrhosis of liver: Secondary | ICD-10-CM

## 2018-04-14 NOTE — Progress Notes (Signed)
ON RECALL  °

## 2018-05-14 ENCOUNTER — Encounter: Payer: Self-pay | Admitting: Internal Medicine

## 2018-08-09 ENCOUNTER — Encounter: Payer: Self-pay | Admitting: Nurse Practitioner

## 2018-08-09 ENCOUNTER — Ambulatory Visit (INDEPENDENT_AMBULATORY_CARE_PROVIDER_SITE_OTHER): Payer: Medicare Other | Admitting: Nurse Practitioner

## 2018-08-09 ENCOUNTER — Other Ambulatory Visit: Payer: Self-pay

## 2018-08-09 VITALS — BP 121/64 | HR 65 | Temp 98.3°F | Ht 76.0 in | Wt 228.0 lb

## 2018-08-09 DIAGNOSIS — D649 Anemia, unspecified: Secondary | ICD-10-CM | POA: Diagnosis not present

## 2018-08-09 DIAGNOSIS — K5903 Drug induced constipation: Secondary | ICD-10-CM

## 2018-08-09 DIAGNOSIS — K7460 Unspecified cirrhosis of liver: Secondary | ICD-10-CM
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
## 2018-08-09 NOTE — Assessment & Plan Note (Signed)
Chronic constipation currently doing well on Linzess 290 mcg a day and MiraLAX as needed.  Recommend he continue his current bowel regimen and follow-up as needed.

## 2018-08-09 NOTE — Assessment & Plan Note (Signed)
The patient sees hematology/oncology at [facility].  On 05/13/2018 they indicated the patient was doing endoscopy but I cannot find rationale to justify this.  We have requested further notes from them.  His anemia seems to be stable with a hemoglobin of 12.0 in the setting of chronic kidney disease to some degree.  He does see nephrology.  He is iron studies are also normal which supports anemia of chronic disease rather than chronic GI bleed.  No overt symptoms including hematochezia or melena.  Further recommendations to follow information from hematology.

## 2018-08-09 NOTE — Assessment & Plan Note (Signed)
Questionable cirrhosis after hep C treatment due to disc concordance between fibro-sure and elastography.  After discussion with the liver clinic in [place] we will keep him on every 85-month monitoring.  EGD completed in 2019 with no noted varices.  Due to no ongoing liver disease status post hepatitis C eradication his next EGD for liver indication would be in 2022.  He is currently on recall.  I cannot find information in the notes from hematology to indicate rationale for repeat EGD 1 year since.  His anemia seems to be stable.  We have requested further information from them and are awaiting notes.

## 2018-08-09 NOTE — Patient Instructions (Signed)
Your health issues we discussed today were:   Liver disease: 1. Have your labs completed in September as recommended 2. I will schedule your ultrasound for early September as well.  Radiology will call you to schedule 3. Call us if you have any worsening or significant symptoms. 4. Continue to abstain from alcohol and drugs as you have been for 9 years.  Anemia: 1. Continue to follow with hematology/oncology at [facility]. 2. Overall your labs look stable 3. I am unsure why they are requesting a repeat upper endoscopy.  We have requested further information from them.   4. If we need to we can schedule your EGD for you over the phone. 5. Call us if you see any bleeding or black stools in your bowel movements  Overall I recommend:  1. Continue current medications 2. Call us if you have any questions or concerns 3. Follow-up in 6 months.   Because of recent events of COVID-19 ("Coronavirus"), follow CDC recommendations:  1. Wash your hand frequently 2. Avoid touching your face 3. Stay away from people who are sick 4. If you have symptoms such as fever, cough, shortness of breath then call your healthcare provider for further guidance 5. If you are sick, STAY AT HOME unless otherwise directed by your healthcare provider. 6. Follow directions from state and national officials regarding staying safe   At [facility] we value your feedback. You may receive a survey about your visit today. Please share your experience as we strive to create trusting relationships with our patients to provide genuine, compassionate, quality care.  We appreciate your understanding and patience as we review any laboratory studies, imaging, and other diagnostic tests that are ordered as we care for you. Our office policy is 5 business days for review of these results, and any emergent or urgent results are addressed in a timely manner for your best interest. If you do not hear from our  office in 1 week, please contact us.   We also encourage the use of MyChart, which contains your medical information for your review as well. If you are not enrolled in this feature, an access code is on this after visit summary for your convenience. Thank you for allowing [place] to be involved in your care.  It was great to see you today!  I hope you have a great summer!!

## 2018-08-09 NOTE — Progress Notes (Signed)
cc'd to pcp 

## 2018-09-14 ENCOUNTER — Other Ambulatory Visit: Payer: Self-pay

## 2018-09-14 DIAGNOSIS — K7460 Unspecified cirrhosis of liver: Secondary | ICD-10-CM

## 2018-09-29 ENCOUNTER — Other Ambulatory Visit: Payer: Self-pay | Admitting: Gastroenterology

## 2018-09-29 LAB — CBC WITH DIFFERENTIAL/PLATELET
Absolute Monocytes: 319 cells/uL (ref 200–950)
Basophils Absolute: 20 cells/uL (ref 0–200)
Basophils Relative: 0.4 %
Eosinophils Absolute: 147 cells/uL (ref 15–500)
Eosinophils Relative: 3 %
HCT: 35.9 % — ABNORMAL LOW (ref 38.5–50.0)
Hemoglobin: 12 g/dL — ABNORMAL LOW (ref 13.2–17.1)
Lymphs Abs: 1103 cells/uL (ref 850–3900)
MCH: 29 pg (ref 27.0–33.0)
MCHC: 33.4 g/dL (ref 32.0–36.0)
MCV: 86.7 fL (ref 80.0–100.0)
MPV: 10 fL (ref 7.5–12.5)
Monocytes Relative: 6.5 %
Neutro Abs: 3312 cells/uL (ref 1500–7800)
Neutrophils Relative %: 67.6 %
Platelets: 224 10*3/uL (ref 140–400)
RBC: 4.14 10*6/uL — ABNORMAL LOW (ref 4.20–5.80)
RDW: 12 % (ref 11.0–15.0)
Total Lymphocyte: 22.5 %
WBC: 4.9 10*3/uL (ref 3.8–10.8)

## 2018-09-29 LAB — COMPREHENSIVE METABOLIC PANEL
AG Ratio: 1.4 (calc) (ref 1.0–2.5)
ALT: 20 U/L (ref 9–46)
AST: 31 U/L (ref 10–35)
Albumin: 4.4 g/dL (ref 3.6–5.1)
Alkaline phosphatase (APISO): 71 U/L (ref 35–144)
BUN/Creatinine Ratio: 22 (calc) (ref 6–22)
BUN: 34 mg/dL — ABNORMAL HIGH (ref 7–25)
CO2: 21 mmol/L (ref 20–32)
Calcium: 9.9 mg/dL (ref 8.6–10.3)
Chloride: 108 mmol/L (ref 98–110)
Creat: 1.58 mg/dL — ABNORMAL HIGH (ref 0.70–1.25)
Globulin: 3.1 g/dL (calc) (ref 1.9–3.7)
Glucose, Bld: 106 mg/dL (ref 65–139)
Potassium: 5 mmol/L (ref 3.5–5.3)
Sodium: 137 mmol/L (ref 135–146)
Total Bilirubin: 0.4 mg/dL (ref 0.2–1.2)
Total Protein: 7.5 g/dL (ref 6.1–8.1)

## 2018-09-29 LAB — PROTIME-INR
INR: 1
Prothrombin Time: 10 s (ref 9.0–11.5)

## 2018-10-11 ENCOUNTER — Telehealth: Payer: Self-pay | Admitting: Internal Medicine

## 2018-10-11 NOTE — Telephone Encounter (Signed)
Recall for ultrasound 

## 2018-10-11 NOTE — Telephone Encounter (Signed)
appt letter mailed

## 2018-10-27 ENCOUNTER — Other Ambulatory Visit: Payer: Self-pay

## 2018-10-27 ENCOUNTER — Ambulatory Visit (HOSPITAL_COMMUNITY)
Admission: RE | Admit: 2018-10-27 | Discharge: 2018-10-27 | Disposition: A | Discharge status: Home / Self Care | Payer: Medicare Other | Source: Ambulatory Visit | Attending: Nurse Practitioner | Admitting: Nurse Practitioner

## 2018-10-27 DIAGNOSIS — K5903 Drug induced constipation: Secondary | ICD-10-CM

## 2018-10-27 DIAGNOSIS — D649 Anemia, unspecified: Secondary | ICD-10-CM | POA: Diagnosis present

## 2018-10-27 DIAGNOSIS — K7460 Unspecified cirrhosis of liver: Secondary | ICD-10-CM | POA: Diagnosis present

## 2018-12-03 DIAGNOSIS — D472 Monoclonal gammopathy: Secondary | ICD-10-CM | POA: Diagnosis not present

## 2018-12-03 DIAGNOSIS — K7460 Unspecified cirrhosis of liver: Secondary | ICD-10-CM | POA: Diagnosis not present

## 2018-12-03 DIAGNOSIS — D649 Anemia, unspecified: Secondary | ICD-10-CM | POA: Diagnosis not present

## 2018-12-07 DIAGNOSIS — R5383 Other fatigue: Secondary | ICD-10-CM | POA: Diagnosis not present

## 2018-12-07 DIAGNOSIS — I10 Essential (primary) hypertension: Secondary | ICD-10-CM | POA: Diagnosis not present

## 2018-12-07 DIAGNOSIS — E039 Hypothyroidism, unspecified: Secondary | ICD-10-CM | POA: Diagnosis not present

## 2018-12-07 DIAGNOSIS — E782 Mixed hyperlipidemia: Secondary | ICD-10-CM | POA: Diagnosis not present

## 2018-12-07 DIAGNOSIS — E875 Hyperkalemia: Secondary | ICD-10-CM | POA: Diagnosis not present

## 2018-12-09 DIAGNOSIS — N289 Disorder of kidney and ureter, unspecified: Secondary | ICD-10-CM | POA: Diagnosis not present

## 2018-12-09 DIAGNOSIS — D649 Anemia, unspecified: Secondary | ICD-10-CM | POA: Diagnosis not present

## 2018-12-09 DIAGNOSIS — D472 Monoclonal gammopathy: Secondary | ICD-10-CM | POA: Diagnosis not present

## 2018-12-10 DIAGNOSIS — M5137 Other intervertebral disc degeneration, lumbosacral region: Secondary | ICD-10-CM | POA: Diagnosis not present

## 2018-12-10 DIAGNOSIS — I10 Essential (primary) hypertension: Secondary | ICD-10-CM | POA: Diagnosis not present

## 2018-12-10 DIAGNOSIS — E039 Hypothyroidism, unspecified: Secondary | ICD-10-CM | POA: Diagnosis not present

## 2018-12-10 DIAGNOSIS — Z0001 Encounter for general adult medical examination with abnormal findings: Secondary | ICD-10-CM | POA: Diagnosis not present

## 2018-12-10 DIAGNOSIS — E782 Mixed hyperlipidemia: Secondary | ICD-10-CM | POA: Diagnosis not present

## 2019-01-22 DIAGNOSIS — M79672 Pain in left foot: Secondary | ICD-10-CM | POA: Diagnosis not present

## 2019-02-04 DIAGNOSIS — M79644 Pain in right finger(s): Secondary | ICD-10-CM | POA: Diagnosis not present

## 2019-02-04 DIAGNOSIS — M25531 Pain in right wrist: Secondary | ICD-10-CM | POA: Diagnosis not present

## 2019-02-09 ENCOUNTER — Encounter: Payer: Self-pay | Admitting: Internal Medicine

## 2019-02-09 ENCOUNTER — Telehealth: Payer: Self-pay | Admitting: Internal Medicine

## 2019-02-09 ENCOUNTER — Ambulatory Visit: Payer: Medicare Other | Admitting: Nurse Practitioner
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
## 2019-02-09 NOTE — Telephone Encounter (Signed)
Patient was a no show and letter sent  °

## 2019-02-10 DIAGNOSIS — S63289A Dislocation of proximal interphalangeal joint of unspecified finger, initial encounter: Secondary | ICD-10-CM | POA: Diagnosis not present

## 2019-02-10 DIAGNOSIS — S62111A Displaced fracture of triquetrum [cuneiform] bone, right wrist, initial encounter for closed fracture: Secondary | ICD-10-CM | POA: Diagnosis not present

## 2019-02-21 DIAGNOSIS — R0789 Other chest pain: Secondary | ICD-10-CM | POA: Diagnosis not present

## 2019-02-21 DIAGNOSIS — E782 Mixed hyperlipidemia: Secondary | ICD-10-CM | POA: Diagnosis not present

## 2019-02-21 DIAGNOSIS — I10 Essential (primary) hypertension: Secondary | ICD-10-CM | POA: Diagnosis not present

## 2019-03-10 ENCOUNTER — Ambulatory Visit (INDEPENDENT_AMBULATORY_CARE_PROVIDER_SITE_OTHER): Payer: Medicare Other | Admitting: Nurse Practitioner

## 2019-03-10 ENCOUNTER — Telehealth: Payer: Self-pay | Admitting: Internal Medicine

## 2019-03-10 ENCOUNTER — Other Ambulatory Visit: Payer: Self-pay

## 2019-03-10 ENCOUNTER — Encounter: Payer: Self-pay | Admitting: Nurse Practitioner

## 2019-03-10 VITALS — BP 139/71 | HR 64 | Temp 97.8°F | Ht 76.0 in | Wt 234.6 lb

## 2019-03-10 DIAGNOSIS — K7460 Unspecified cirrhosis of liver: Secondary | ICD-10-CM

## 2019-03-10 DIAGNOSIS — D649 Anemia, unspecified: Secondary | ICD-10-CM | POA: Diagnosis not present

## 2019-03-10 NOTE — Assessment & Plan Note (Signed)
Based on ultrasound elastography the patient has F3/F4 fibrosis consistent with cirrhosis, although Fibro-sure benign document fibrosis is a degree.  After discussion with the [facility] liver clinic we will proceed as if the patient has cirrhosis including regular hepatoma screening and monitoring labs for any follow-up in liver function.  He denies any overt hepatic symptoms at this time.  He is currently coming due for his labs and imaging and we will put in orders at this time.  Follow-up in 6 months.  Call for any worsening or significant symptoms.

## 2019-03-10 NOTE — Progress Notes (Signed)
Cc'ed to pcp °

## 2019-03-10 NOTE — Patient Instructions (Signed)
Your health issues we discussed today were:   Liver disease: 1. Have your labs completed when you are able to 2. We will help schedule your ultrasound for you 3. Call us if you have any worrisome symptoms  Anemia (low blood levels): 1. As we discussed, this is likely due to your kidney disease 2. Your blood levels have been stable for some time 3. Part of your liver disease labs include a blood cell count which will allow [place] to evaluate for any worsening blood loss 4. Call us if you see any bleeding in your stools, or "pitch black, tarry" stools 5. Let us know if you have worsening fatigue, weakness, dizziness, passing out, nearly passing out  Overall I recommend:  1. Continue your other current medications 2. Return for follow-up in 6 months 3. Call us if you have any questions or concerns   ---------------------------------------------------------------  I am glad you got your COVID-19 vaccine!  To best protect yourself, continue to wear a mask and socially distance (6 feet away from other individuals when possible).  ---------------------------------------------------------------   At [facility] we value your feedback. You may receive a survey about your visit today. Please share your experience as we strive to create trusting relationships with our patients to provide genuine, compassionate, quality care.  We appreciate your understanding and patience as we review any laboratory studies, imaging, and other diagnostic tests that are ordered as we care for you. Our office policy is 5 business days for review of these results, and any emergent or urgent results are addressed in a timely manner for your best interest. If you do not hear from our office in 1 week, please contact us.   We also encourage the use of MyChart, which contains your medical information for your review as well. If you are not enrolled in this feature, an access code is on this after visit  summary for your convenience. Thank you for allowing [place] to be involved in your care.  It was great to see you today!  I hope you have a great day!!
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
## 2019-03-10 NOTE — Telephone Encounter (Signed)
error 

## 2019-03-10 NOTE — Assessment & Plan Note (Signed)
Chronic anemia, sees hematology/oncology at [facility].  Denies any overt bleeding.  Anemia is mild and stable, as of last labs.  He does see nephrology for kidney disease and this is likely the etiology driving his anemia.  We will check labs and have him follow-up in 6 months.  No symptoms of worsening anemia at this time.  Call for any worsening or significant symptoms.

## 2019-03-11 DIAGNOSIS — D649 Anemia, unspecified: Secondary | ICD-10-CM | POA: Diagnosis not present

## 2019-03-11 DIAGNOSIS — K7460 Unspecified cirrhosis of liver: Secondary | ICD-10-CM | POA: Diagnosis not present

## 2019-03-14 LAB — COMPREHENSIVE METABOLIC PANEL
AG Ratio: 1.3 (calc) (ref 1.0–2.5)
ALT: 14 U/L (ref 9–46)
AST: 16 U/L (ref 10–35)
Albumin: 4.4 g/dL (ref 3.6–5.1)
Alkaline phosphatase (APISO): 85 U/L (ref 35–144)
BUN/Creatinine Ratio: 17 (calc) (ref 6–22)
BUN: 25 mg/dL (ref 7–25)
CO2: 24 mmol/L (ref 20–32)
Calcium: 9.7 mg/dL (ref 8.6–10.3)
Chloride: 104 mmol/L (ref 98–110)
Creat: 1.48 mg/dL — ABNORMAL HIGH (ref 0.70–1.25)
Globulin: 3.4 g/dL (calc) (ref 1.9–3.7)
Glucose, Bld: 76 mg/dL (ref 65–139)
Potassium: 4.3 mmol/L (ref 3.5–5.3)
Sodium: 138 mmol/L (ref 135–146)
Total Bilirubin: 0.6 mg/dL (ref 0.2–1.2)
Total Protein: 7.8 g/dL (ref 6.1–8.1)

## 2019-03-14 LAB — CBC WITH DIFFERENTIAL/PLATELET
Absolute Monocytes: 372 cells/uL (ref 200–950)
Basophils Absolute: 18 cells/uL (ref 0–200)
Basophils Relative: 0.3 %
Eosinophils Absolute: 180 cells/uL (ref 15–500)
Eosinophils Relative: 3 %
HCT: 36.8 % — ABNORMAL LOW (ref 38.5–50.0)
Hemoglobin: 12.3 g/dL — ABNORMAL LOW (ref 13.2–17.1)
Lymphs Abs: 1344 cells/uL (ref 850–3900)
MCH: 29.3 pg (ref 27.0–33.0)
MCHC: 33.4 g/dL (ref 32.0–36.0)
MCV: 87.6 fL (ref 80.0–100.0)
MPV: 9.9 fL (ref 7.5–12.5)
Monocytes Relative: 6.2 %
Neutro Abs: 4086 cells/uL (ref 1500–7800)
Neutrophils Relative %: 68.1 %
Platelets: 235 10*3/uL (ref 140–400)
RBC: 4.2 10*6/uL (ref 4.20–5.80)
RDW: 12.1 % (ref 11.0–15.0)
Total Lymphocyte: 22.4 %
WBC: 6 10*3/uL (ref 3.8–10.8)

## 2019-03-14 LAB — PROTIME-INR
INR: 1
Prothrombin Time: 10.5 s (ref 9.0–11.5)

## 2019-03-14 LAB — AFP TUMOR MARKER: AFP-Tumor Marker: 1 ng/mL (ref <6.1)

## 2019-03-17 ENCOUNTER — Ambulatory Visit (HOSPITAL_COMMUNITY): Admission: RE | Admit: 2019-03-17 | Payer: Medicare Other | Source: Ambulatory Visit

## 2019-03-23 ENCOUNTER — Other Ambulatory Visit: Payer: Self-pay

## 2019-03-23 ENCOUNTER — Ambulatory Visit (HOSPITAL_COMMUNITY)
Admission: RE | Admit: 2019-03-23 | Discharge: 2019-03-23 | Disposition: A | Discharge status: Home / Self Care | Payer: Medicare Other | Source: Ambulatory Visit | Attending: Nurse Practitioner | Admitting: Nurse Practitioner

## 2019-03-23 DIAGNOSIS — K7689 Other specified diseases of liver: Secondary | ICD-10-CM | POA: Diagnosis not present

## 2019-03-23 DIAGNOSIS — D649 Anemia, unspecified: Secondary | ICD-10-CM | POA: Diagnosis not present

## 2019-03-23 DIAGNOSIS — K7460 Unspecified cirrhosis of liver: Secondary | ICD-10-CM | POA: Diagnosis not present

## 2019-04-08 ENCOUNTER — Other Ambulatory Visit: Payer: Self-pay

## 2019-04-08 NOTE — Patient Outreach (Signed)
[facility]) Care Management  04/08/2019  Colton Gibbs 09-15-1955 [id number]   Medication Adherence call to [name] [facility] [employer] Verify spoke with patient he is past due on Benazepril/Hctz 20/12.5 mg,patient explain he is taking 1 tablet daily,patient has 2 more tablets and will order when finished,he said he can walk to the pharmacy an order it the same day. [name] is showing past due under [place].   [facility] Management Direct Dial [phone]  Fax [phone] [email or web address]

## 2019-06-06 DIAGNOSIS — G8929 Other chronic pain: Secondary | ICD-10-CM | POA: Diagnosis not present

## 2019-06-16 DIAGNOSIS — J948 Other specified pleural conditions: Secondary | ICD-10-CM | POA: Diagnosis not present

## 2019-06-16 DIAGNOSIS — E039 Hypothyroidism, unspecified: Secondary | ICD-10-CM | POA: Diagnosis not present

## 2019-06-16 DIAGNOSIS — D649 Anemia, unspecified: Secondary | ICD-10-CM | POA: Diagnosis not present

## 2019-06-16 DIAGNOSIS — D472 Monoclonal gammopathy: Secondary | ICD-10-CM | POA: Diagnosis not present

## 2019-06-16 DIAGNOSIS — Z8619 Personal history of other infectious and parasitic diseases: Secondary | ICD-10-CM | POA: Diagnosis not present

## 2019-06-20 DIAGNOSIS — I10 Essential (primary) hypertension: Secondary | ICD-10-CM | POA: Diagnosis not present

## 2019-06-20 DIAGNOSIS — E782 Mixed hyperlipidemia: Secondary | ICD-10-CM | POA: Diagnosis not present

## 2019-06-20 DIAGNOSIS — R946 Abnormal results of thyroid function studies: Secondary | ICD-10-CM | POA: Diagnosis not present

## 2019-06-20 DIAGNOSIS — N183 Chronic kidney disease, stage 3 unspecified: Secondary | ICD-10-CM | POA: Diagnosis not present

## 2019-06-23 DIAGNOSIS — E782 Mixed hyperlipidemia: Secondary | ICD-10-CM | POA: Diagnosis not present

## 2019-06-23 DIAGNOSIS — D649 Anemia, unspecified: Secondary | ICD-10-CM | POA: Diagnosis not present

## 2019-06-23 DIAGNOSIS — J449 Chronic obstructive pulmonary disease, unspecified: Secondary | ICD-10-CM | POA: Diagnosis not present

## 2019-06-23 DIAGNOSIS — R4582 Worries: Secondary | ICD-10-CM | POA: Diagnosis not present

## 2019-06-23 DIAGNOSIS — I10 Essential (primary) hypertension: Secondary | ICD-10-CM | POA: Diagnosis not present

## 2019-06-23 DIAGNOSIS — D472 Monoclonal gammopathy: Secondary | ICD-10-CM | POA: Diagnosis not present

## 2019-06-23 DIAGNOSIS — G8921 Chronic pain due to trauma: Secondary | ICD-10-CM | POA: Diagnosis not present

## 2019-07-18 DIAGNOSIS — G8929 Other chronic pain: Secondary | ICD-10-CM | POA: Diagnosis not present

## 2019-08-21 DIAGNOSIS — M545 Low back pain: Secondary | ICD-10-CM | POA: Diagnosis not present

## 2019-08-21 DIAGNOSIS — S299XXS Unspecified injury of thorax, sequela: Secondary | ICD-10-CM | POA: Diagnosis not present

## 2019-08-21 DIAGNOSIS — Z79899 Other long term (current) drug therapy: Secondary | ICD-10-CM | POA: Diagnosis not present

## 2019-08-21 DIAGNOSIS — G8929 Other chronic pain: Secondary | ICD-10-CM | POA: Diagnosis not present

## 2019-09-01 DIAGNOSIS — N289 Disorder of kidney and ureter, unspecified: Secondary | ICD-10-CM | POA: Diagnosis not present

## 2019-09-01 DIAGNOSIS — D472 Monoclonal gammopathy: Secondary | ICD-10-CM | POA: Diagnosis not present

## 2019-09-01 DIAGNOSIS — D649 Anemia, unspecified: Secondary | ICD-10-CM | POA: Diagnosis not present

## 2019-09-02 DIAGNOSIS — Z79899 Other long term (current) drug therapy: Secondary | ICD-10-CM | POA: Diagnosis not present

## 2019-09-07 ENCOUNTER — Ambulatory Visit: Payer: Medicare Other | Admitting: Nurse Practitioner

## 2019-09-07 ENCOUNTER — Encounter: Payer: Self-pay | Admitting: Internal Medicine
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
## 2019-09-27 ENCOUNTER — Encounter: Payer: Self-pay | Admitting: Internal Medicine

## 2019-09-27 DIAGNOSIS — R825 Elevated urine levels of drugs, medicaments and biological substances: Secondary | ICD-10-CM | POA: Diagnosis not present

## 2019-09-27 DIAGNOSIS — Z7189 Other specified counseling: Secondary | ICD-10-CM | POA: Diagnosis not present

## 2019-09-27 DIAGNOSIS — G8921 Chronic pain due to trauma: Secondary | ICD-10-CM | POA: Diagnosis not present

## 2019-10-06 DIAGNOSIS — Z79891 Long term (current) use of opiate analgesic: Secondary | ICD-10-CM | POA: Diagnosis not present

## 2019-10-06 DIAGNOSIS — G894 Chronic pain syndrome: Secondary | ICD-10-CM | POA: Diagnosis not present

## 2019-10-18 DIAGNOSIS — Z79891 Long term (current) use of opiate analgesic: Secondary | ICD-10-CM | POA: Diagnosis not present

## 2019-10-26 DIAGNOSIS — G8921 Chronic pain due to trauma: Secondary | ICD-10-CM | POA: Diagnosis not present

## 2019-10-26 DIAGNOSIS — G894 Chronic pain syndrome: Secondary | ICD-10-CM | POA: Diagnosis not present

## 2019-10-26 DIAGNOSIS — D472 Monoclonal gammopathy: Secondary | ICD-10-CM | POA: Diagnosis not present

## 2019-11-11 DIAGNOSIS — N1832 Chronic kidney disease, stage 3b: Secondary | ICD-10-CM | POA: Diagnosis not present

## 2019-11-11 DIAGNOSIS — N170 Acute kidney failure with tubular necrosis: Secondary | ICD-10-CM | POA: Diagnosis not present

## 2019-11-11 DIAGNOSIS — I129 Hypertensive chronic kidney disease with stage 1 through stage 4 chronic kidney disease, or unspecified chronic kidney disease: Secondary | ICD-10-CM | POA: Diagnosis not present

## 2019-11-11 DIAGNOSIS — D472 Monoclonal gammopathy: Secondary | ICD-10-CM | POA: Diagnosis not present

## 2019-11-11 DIAGNOSIS — E875 Hyperkalemia: Secondary | ICD-10-CM | POA: Diagnosis not present

## 2019-11-14 ENCOUNTER — Ambulatory Visit: Payer: Medicare Other | Admitting: Nurse Practitioner

## 2019-11-21 DIAGNOSIS — N1832 Chronic kidney disease, stage 3b: Secondary | ICD-10-CM | POA: Diagnosis not present

## 2019-11-21 DIAGNOSIS — E875 Hyperkalemia: Secondary | ICD-10-CM | POA: Diagnosis not present

## 2019-11-21 DIAGNOSIS — I129 Hypertensive chronic kidney disease with stage 1 through stage 4 chronic kidney disease, or unspecified chronic kidney disease: Secondary | ICD-10-CM | POA: Diagnosis not present

## 2019-11-21 DIAGNOSIS — N19 Unspecified kidney failure: Secondary | ICD-10-CM | POA: Diagnosis not present

## 2019-11-21 DIAGNOSIS — D472 Monoclonal gammopathy: Secondary | ICD-10-CM | POA: Diagnosis not present

## 2019-11-21 DIAGNOSIS — N170 Acute kidney failure with tubular necrosis: Secondary | ICD-10-CM | POA: Diagnosis not present

## 2019-11-25 DIAGNOSIS — D638 Anemia in other chronic diseases classified elsewhere: Secondary | ICD-10-CM | POA: Diagnosis not present

## 2019-11-25 DIAGNOSIS — D472 Monoclonal gammopathy: Secondary | ICD-10-CM | POA: Diagnosis not present

## 2019-11-25 DIAGNOSIS — I129 Hypertensive chronic kidney disease with stage 1 through stage 4 chronic kidney disease, or unspecified chronic kidney disease: Secondary | ICD-10-CM | POA: Diagnosis not present

## 2019-11-25 DIAGNOSIS — N170 Acute kidney failure with tubular necrosis: Secondary | ICD-10-CM | POA: Diagnosis not present

## 2019-11-25 DIAGNOSIS — N1832 Chronic kidney disease, stage 3b: Secondary | ICD-10-CM | POA: Diagnosis not present

## 2019-11-30 ENCOUNTER — Ambulatory Visit (INDEPENDENT_AMBULATORY_CARE_PROVIDER_SITE_OTHER): Payer: Medicare Other | Admitting: Nurse Practitioner

## 2019-11-30 ENCOUNTER — Encounter: Payer: Self-pay | Admitting: *Deleted

## 2019-11-30 ENCOUNTER — Other Ambulatory Visit: Payer: Self-pay

## 2019-11-30 ENCOUNTER — Encounter: Payer: Self-pay | Admitting: Nurse Practitioner

## 2019-11-30 VITALS — BP 192/87 | HR 65 | Temp 97.3°F | Ht 76.0 in | Wt 228.2 lb

## 2019-11-30 DIAGNOSIS — Z8619 Personal history of other infectious and parasitic diseases: Secondary | ICD-10-CM

## 2019-11-30 DIAGNOSIS — D649 Anemia, unspecified: Secondary | ICD-10-CM | POA: Diagnosis not present

## 2019-11-30 DIAGNOSIS — K5903 Drug induced constipation: Secondary | ICD-10-CM | POA: Diagnosis not present

## 2019-11-30 DIAGNOSIS — K7460 Unspecified cirrhosis of liver: Secondary | ICD-10-CM

## 2019-11-30 MED ORDER — LINACLOTIDE 290 MCG PO CAPS: 290.0000 ug | ORAL_CAPSULE | Freq: Every day | ORAL | 3 refills | Status: AC
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
## 2019-11-30 NOTE — Patient Instructions (Signed)
Your health issues we discussed today were:   Constipation: 1. I am glad your constipation is doing well on Linzess and occasional MiraLAX 2. I have sent a refill of Linzess to your pharmacy 3. Continue to take Linzess 290 mcg once daily. 4. You can use MiraLAX as needed for breakthrough constipation 5. Call us if you have any worsening or severe symptoms  Cirrhosis due to hepatitis C: 1. Have your labs drawn when you are able to 2. We will help schedule your ultrasound your liver 3. Try to limit alcohol to no more than 2 drinks a day 4. Call us for any concerning symptoms such as yellowing of your skin/eyes, brown urine, sudden confusion/new onset confusion  Anemia: 1. As we discussed, your anemia is likely due to your chronic kidney disease 2. Your kidneys have appeared stable over the past 1 to 2 years and subsequently your hemoglobin (sign of blood loss) has also remained stable 3. We will recheck your labs for your liver diseases allow [place] to check your hemoglobin as well 4. Call us if you see any obvious bleeding or have any symptoms such as severe, progressive weakness, dizziness, fatigue, shortness of breath, passing out, nearly passing out  Overall I recommend:  1. Continue your other current medications 2. Return for follow-up in 6 months 3. Call for any questions or concerns   ---------------------------------------------------------------  I am glad you have gotten your COVID-19 vaccination!  Even though you are fully vaccinated you should continue to follow CDC and state/local guidelines.  ---------------------------------------------------------------   At [facility] we value your feedback. You may receive a survey about your visit today. Please share your experience as we strive to create trusting relationships with our patients to provide genuine, compassionate, quality care.  We appreciate your understanding and patience as we review any laboratory  studies, imaging, and other diagnostic tests that are ordered as we care for you. Our office policy is 5 business days for review of these results, and any emergent or urgent results are addressed in a timely manner for your best interest. If you do not hear from our office in 1 week, please contact us.   We also encourage the use of MyChart, which contains your medical information for your review as well. If you are not enrolled in this feature, an access code is on this after visit summary for your convenience. Thank you for allowing [place] to be involved in your care.  It was great to see you today!  I hope you have a Happy Thanksgiving!!

## 2019-12-07 ENCOUNTER — Ambulatory Visit (HOSPITAL_COMMUNITY): Payer: Medicare Other

## 2020-02-02 DIAGNOSIS — I499 Cardiac arrhythmia, unspecified: Secondary | ICD-10-CM | POA: Diagnosis not present

## 2020-02-02 DIAGNOSIS — Z23 Encounter for immunization: Secondary | ICD-10-CM | POA: Diagnosis not present

## 2020-02-02 DIAGNOSIS — J209 Acute bronchitis, unspecified: Secondary | ICD-10-CM | POA: Diagnosis not present

## 2020-02-27 DIAGNOSIS — G8921 Chronic pain due to trauma: Secondary | ICD-10-CM | POA: Diagnosis not present

## 2020-02-27 DIAGNOSIS — Z8619 Personal history of other infectious and parasitic diseases: Secondary | ICD-10-CM | POA: Diagnosis not present

## 2020-02-27 DIAGNOSIS — M19132 Post-traumatic osteoarthritis, left wrist: Secondary | ICD-10-CM | POA: Diagnosis not present

## 2020-02-27 DIAGNOSIS — D472 Monoclonal gammopathy: Secondary | ICD-10-CM | POA: Diagnosis not present

## 2020-03-05 DIAGNOSIS — N170 Acute kidney failure with tubular necrosis: Secondary | ICD-10-CM | POA: Diagnosis not present

## 2020-03-05 DIAGNOSIS — I129 Hypertensive chronic kidney disease with stage 1 through stage 4 chronic kidney disease, or unspecified chronic kidney disease: Secondary | ICD-10-CM | POA: Diagnosis not present

## 2020-03-05 DIAGNOSIS — E782 Mixed hyperlipidemia: Secondary | ICD-10-CM | POA: Diagnosis not present

## 2020-03-05 DIAGNOSIS — I10 Essential (primary) hypertension: Secondary | ICD-10-CM | POA: Diagnosis not present

## 2020-03-05 DIAGNOSIS — N1832 Chronic kidney disease, stage 3b: Secondary | ICD-10-CM | POA: Diagnosis not present

## 2020-03-05 DIAGNOSIS — E875 Hyperkalemia: Secondary | ICD-10-CM | POA: Diagnosis not present

## 2020-03-05 DIAGNOSIS — B700 Diphyllobothriasis: Secondary | ICD-10-CM | POA: Diagnosis not present

## 2020-03-05 DIAGNOSIS — D472 Monoclonal gammopathy: Secondary | ICD-10-CM | POA: Diagnosis not present

## 2020-03-05 DIAGNOSIS — D638 Anemia in other chronic diseases classified elsewhere: Secondary | ICD-10-CM | POA: Diagnosis not present

## 2020-03-09 DIAGNOSIS — E785 Hyperlipidemia, unspecified: Secondary | ICD-10-CM | POA: Diagnosis not present

## 2020-03-09 DIAGNOSIS — N183 Chronic kidney disease, stage 3 unspecified: Secondary | ICD-10-CM | POA: Diagnosis not present

## 2020-03-09 DIAGNOSIS — I129 Hypertensive chronic kidney disease with stage 1 through stage 4 chronic kidney disease, or unspecified chronic kidney disease: Secondary | ICD-10-CM | POA: Diagnosis not present

## 2020-03-09 DIAGNOSIS — S62102A Fracture of unspecified carpal bone, left wrist, initial encounter for closed fracture: Secondary | ICD-10-CM | POA: Diagnosis not present

## 2020-03-09 DIAGNOSIS — M7989 Other specified soft tissue disorders: Secondary | ICD-10-CM | POA: Diagnosis not present

## 2020-03-09 DIAGNOSIS — E1122 Type 2 diabetes mellitus with diabetic chronic kidney disease: Secondary | ICD-10-CM | POA: Diagnosis not present

## 2020-03-09 DIAGNOSIS — W1839XA Other fall on same level, initial encounter: Secondary | ICD-10-CM | POA: Diagnosis not present

## 2020-03-12 DIAGNOSIS — M542 Cervicalgia: Secondary | ICD-10-CM | POA: Diagnosis not present

## 2020-03-12 DIAGNOSIS — M47812 Spondylosis without myelopathy or radiculopathy, cervical region: Secondary | ICD-10-CM | POA: Diagnosis not present

## 2020-03-12 DIAGNOSIS — R519 Headache, unspecified: Secondary | ICD-10-CM | POA: Diagnosis not present

## 2020-03-13 DIAGNOSIS — S52592A Other fractures of lower end of left radius, initial encounter for closed fracture: Secondary | ICD-10-CM | POA: Diagnosis not present

## 2020-03-13 DIAGNOSIS — W19XXXA Unspecified fall, initial encounter: Secondary | ICD-10-CM | POA: Diagnosis not present

## 2020-03-21 DIAGNOSIS — N1832 Chronic kidney disease, stage 3b: Secondary | ICD-10-CM | POA: Diagnosis not present

## 2020-03-21 DIAGNOSIS — D472 Monoclonal gammopathy: Secondary | ICD-10-CM | POA: Diagnosis not present

## 2020-03-21 DIAGNOSIS — D638 Anemia in other chronic diseases classified elsewhere: Secondary | ICD-10-CM | POA: Diagnosis not present

## 2020-03-21 DIAGNOSIS — I129 Hypertensive chronic kidney disease with stage 1 through stage 4 chronic kidney disease, or unspecified chronic kidney disease: Secondary | ICD-10-CM | POA: Diagnosis not present

## 2020-03-21 DIAGNOSIS — R809 Proteinuria, unspecified: Secondary | ICD-10-CM | POA: Diagnosis not present

## 2020-03-21 DIAGNOSIS — E872 Acidosis: Secondary | ICD-10-CM | POA: Diagnosis not present

## 2020-03-27 DIAGNOSIS — Z1159 Encounter for screening for other viral diseases: Secondary | ICD-10-CM | POA: Diagnosis not present

## 2020-03-27 DIAGNOSIS — E782 Mixed hyperlipidemia: Secondary | ICD-10-CM | POA: Diagnosis not present

## 2020-03-27 DIAGNOSIS — E039 Hypothyroidism, unspecified: Secondary | ICD-10-CM | POA: Diagnosis not present

## 2020-03-27 DIAGNOSIS — R5383 Other fatigue: Secondary | ICD-10-CM | POA: Diagnosis not present

## 2020-03-27 DIAGNOSIS — E7849 Other hyperlipidemia: Secondary | ICD-10-CM | POA: Diagnosis not present

## 2020-03-27 DIAGNOSIS — N183 Chronic kidney disease, stage 3 unspecified: Secondary | ICD-10-CM | POA: Diagnosis not present

## 2020-03-30 DIAGNOSIS — E039 Hypothyroidism, unspecified: Secondary | ICD-10-CM | POA: Diagnosis not present

## 2020-03-30 DIAGNOSIS — M545 Low back pain, unspecified: Secondary | ICD-10-CM | POA: Diagnosis not present

## 2020-03-30 DIAGNOSIS — Z23 Encounter for immunization: Secondary | ICD-10-CM | POA: Diagnosis not present

## 2020-03-30 DIAGNOSIS — Z1212 Encounter for screening for malignant neoplasm of rectum: Secondary | ICD-10-CM | POA: Diagnosis not present

## 2020-03-30 DIAGNOSIS — D472 Monoclonal gammopathy: Secondary | ICD-10-CM | POA: Diagnosis not present

## 2020-03-30 DIAGNOSIS — E7849 Other hyperlipidemia: Secondary | ICD-10-CM | POA: Diagnosis not present

## 2020-03-30 DIAGNOSIS — I10 Essential (primary) hypertension: Secondary | ICD-10-CM | POA: Diagnosis not present

## 2020-03-30 DIAGNOSIS — E782 Mixed hyperlipidemia: Secondary | ICD-10-CM | POA: Diagnosis not present

## 2020-03-30 DIAGNOSIS — Z0001 Encounter for general adult medical examination with abnormal findings: Secondary | ICD-10-CM | POA: Diagnosis not present

## 2020-03-30 DIAGNOSIS — J449 Chronic obstructive pulmonary disease, unspecified: Secondary | ICD-10-CM | POA: Diagnosis not present

## 2020-04-05 DIAGNOSIS — S52592D Other fractures of lower end of left radius, subsequent encounter for closed fracture with routine healing: Secondary | ICD-10-CM | POA: Diagnosis not present

## 2020-04-18 DIAGNOSIS — G894 Chronic pain syndrome: Secondary | ICD-10-CM | POA: Diagnosis not present

## 2020-04-18 DIAGNOSIS — M545 Low back pain, unspecified: Secondary | ICD-10-CM | POA: Diagnosis not present

## 2020-04-18 DIAGNOSIS — M542 Cervicalgia: Secondary | ICD-10-CM | POA: Diagnosis not present

## 2020-04-24 DIAGNOSIS — J329 Chronic sinusitis, unspecified: Secondary | ICD-10-CM | POA: Diagnosis not present

## 2020-05-11 DIAGNOSIS — D472 Monoclonal gammopathy: Secondary | ICD-10-CM | POA: Diagnosis not present

## 2020-05-11 DIAGNOSIS — I10 Essential (primary) hypertension: Secondary | ICD-10-CM | POA: Diagnosis not present

## 2020-05-11 DIAGNOSIS — E7849 Other hyperlipidemia: Secondary | ICD-10-CM | POA: Diagnosis not present

## 2020-05-11 DIAGNOSIS — E039 Hypothyroidism, unspecified: Secondary | ICD-10-CM | POA: Diagnosis not present

## 2020-05-11 DIAGNOSIS — J449 Chronic obstructive pulmonary disease, unspecified: Secondary | ICD-10-CM | POA: Diagnosis not present

## 2020-05-22 DIAGNOSIS — S52592D Other fractures of lower end of left radius, subsequent encounter for closed fracture with routine healing: Secondary | ICD-10-CM | POA: Diagnosis not present

## 2020-05-29 ENCOUNTER — Ambulatory Visit: Payer: Medicare Other | Admitting: Nurse Practitioner

## 2020-07-11 DIAGNOSIS — J441 Chronic obstructive pulmonary disease with (acute) exacerbation: Secondary | ICD-10-CM | POA: Diagnosis not present

## 2020-07-24 ENCOUNTER — Ambulatory Visit: Payer: Medicare Other | Admitting: Internal Medicine

## 2020-07-24 ENCOUNTER — Encounter: Payer: Self-pay | Admitting: Internal Medicine

## 2020-08-01 DIAGNOSIS — K625 Hemorrhage of anus and rectum: Secondary | ICD-10-CM | POA: Diagnosis not present

## 2020-08-01 DIAGNOSIS — D649 Anemia, unspecified: Secondary | ICD-10-CM | POA: Diagnosis not present

## 2020-08-01 DIAGNOSIS — D519 Vitamin B12 deficiency anemia, unspecified: Secondary | ICD-10-CM | POA: Diagnosis not present

## 2020-08-01 DIAGNOSIS — R55 Syncope and collapse: Secondary | ICD-10-CM | POA: Diagnosis not present

## 2020-08-01 DIAGNOSIS — D529 Folate deficiency anemia, unspecified: Secondary | ICD-10-CM | POA: Diagnosis not present

## 2020-08-27 DIAGNOSIS — R062 Wheezing: Secondary | ICD-10-CM | POA: Diagnosis not present

## 2020-08-27 DIAGNOSIS — J449 Chronic obstructive pulmonary disease, unspecified: Secondary | ICD-10-CM | POA: Diagnosis not present

## 2020-08-27 DIAGNOSIS — R064 Hyperventilation: Secondary | ICD-10-CM | POA: Diagnosis not present

## 2020-08-27 DIAGNOSIS — R0602 Shortness of breath: Secondary | ICD-10-CM | POA: Diagnosis not present

## 2020-08-28 DIAGNOSIS — J449 Chronic obstructive pulmonary disease, unspecified: Secondary | ICD-10-CM | POA: Diagnosis not present

## 2020-08-28 DIAGNOSIS — Z5181 Encounter for therapeutic drug level monitoring: Secondary | ICD-10-CM | POA: Diagnosis not present

## 2020-08-28 DIAGNOSIS — D472 Monoclonal gammopathy: Secondary | ICD-10-CM | POA: Diagnosis not present

## 2020-08-28 DIAGNOSIS — N183 Chronic kidney disease, stage 3 unspecified: Secondary | ICD-10-CM | POA: Diagnosis not present

## 2020-08-28 DIAGNOSIS — R600 Localized edema: Secondary | ICD-10-CM | POA: Diagnosis not present

## 2020-08-28 DIAGNOSIS — R0602 Shortness of breath: Secondary | ICD-10-CM | POA: Diagnosis not present

## 2020-09-01 DIAGNOSIS — J449 Chronic obstructive pulmonary disease, unspecified: Secondary | ICD-10-CM | POA: Diagnosis not present

## 2020-09-01 DIAGNOSIS — K219 Gastro-esophageal reflux disease without esophagitis: Secondary | ICD-10-CM | POA: Diagnosis not present

## 2020-09-01 DIAGNOSIS — Z880 Allergy status to penicillin: Secondary | ICD-10-CM | POA: Diagnosis not present

## 2020-09-01 DIAGNOSIS — N1831 Chronic kidney disease, stage 3a: Secondary | ICD-10-CM | POA: Diagnosis not present

## 2020-09-01 DIAGNOSIS — I4891 Unspecified atrial fibrillation: Secondary | ICD-10-CM | POA: Diagnosis not present

## 2020-09-01 DIAGNOSIS — E039 Hypothyroidism, unspecified: Secondary | ICD-10-CM | POA: Diagnosis not present

## 2020-09-01 DIAGNOSIS — G894 Chronic pain syndrome: Secondary | ICD-10-CM | POA: Diagnosis not present

## 2020-09-01 DIAGNOSIS — R42 Dizziness and giddiness: Secondary | ICD-10-CM | POA: Diagnosis not present

## 2020-09-01 DIAGNOSIS — I480 Paroxysmal atrial fibrillation: Secondary | ICD-10-CM | POA: Diagnosis not present

## 2020-09-01 DIAGNOSIS — I517 Cardiomegaly: Secondary | ICD-10-CM | POA: Diagnosis not present

## 2020-09-01 DIAGNOSIS — R0600 Dyspnea, unspecified: Secondary | ICD-10-CM | POA: Diagnosis not present

## 2020-09-01 DIAGNOSIS — I5031 Acute diastolic (congestive) heart failure: Secondary | ICD-10-CM | POA: Diagnosis not present

## 2020-09-01 DIAGNOSIS — E871 Hypo-osmolality and hyponatremia: Secondary | ICD-10-CM | POA: Diagnosis not present

## 2020-09-01 DIAGNOSIS — I428 Other cardiomyopathies: Secondary | ICD-10-CM | POA: Diagnosis not present

## 2020-09-01 DIAGNOSIS — E063 Autoimmune thyroiditis: Secondary | ICD-10-CM | POA: Diagnosis not present

## 2020-09-01 DIAGNOSIS — D649 Anemia, unspecified: Secondary | ICD-10-CM | POA: Diagnosis not present

## 2020-09-01 DIAGNOSIS — R000 Tachycardia, unspecified: Secondary | ICD-10-CM | POA: Diagnosis not present

## 2020-09-01 DIAGNOSIS — I4510 Unspecified right bundle-branch block: Secondary | ICD-10-CM | POA: Diagnosis not present

## 2020-09-01 DIAGNOSIS — Z20822 Contact with and (suspected) exposure to covid-19: Secondary | ICD-10-CM | POA: Diagnosis not present

## 2020-09-01 DIAGNOSIS — R0602 Shortness of breath: Secondary | ICD-10-CM | POA: Diagnosis not present

## 2020-09-01 DIAGNOSIS — I509 Heart failure, unspecified: Secondary | ICD-10-CM | POA: Diagnosis not present

## 2020-09-01 DIAGNOSIS — E785 Hyperlipidemia, unspecified: Secondary | ICD-10-CM | POA: Diagnosis not present

## 2020-09-01 DIAGNOSIS — I130 Hypertensive heart and chronic kidney disease with heart failure and stage 1 through stage 4 chronic kidney disease, or unspecified chronic kidney disease: Secondary | ICD-10-CM | POA: Diagnosis not present

## 2020-09-01 DIAGNOSIS — R600 Localized edema: Secondary | ICD-10-CM | POA: Diagnosis not present

## 2020-09-01 DIAGNOSIS — I493 Ventricular premature depolarization: Secondary | ICD-10-CM | POA: Diagnosis not present

## 2020-09-05 DIAGNOSIS — W19XXXA Unspecified fall, initial encounter: Secondary | ICD-10-CM | POA: Diagnosis not present

## 2020-09-05 DIAGNOSIS — R58 Hemorrhage, not elsewhere classified: Secondary | ICD-10-CM | POA: Diagnosis not present

## 2020-09-05 DIAGNOSIS — E860 Dehydration: Secondary | ICD-10-CM | POA: Diagnosis not present

## 2020-09-05 DIAGNOSIS — I4891 Unspecified atrial fibrillation: Secondary | ICD-10-CM | POA: Diagnosis not present

## 2020-09-05 DIAGNOSIS — R42 Dizziness and giddiness: Secondary | ICD-10-CM | POA: Diagnosis not present

## 2020-09-06 DIAGNOSIS — I480 Paroxysmal atrial fibrillation: Secondary | ICD-10-CM | POA: Diagnosis not present

## 2020-09-06 DIAGNOSIS — I5031 Acute diastolic (congestive) heart failure: Secondary | ICD-10-CM | POA: Diagnosis not present

## 2020-09-06 DIAGNOSIS — R000 Tachycardia, unspecified: Secondary | ICD-10-CM | POA: Diagnosis not present

## 2020-09-10 DIAGNOSIS — N183 Chronic kidney disease, stage 3 unspecified: Secondary | ICD-10-CM | POA: Diagnosis not present

## 2020-09-10 DIAGNOSIS — D649 Anemia, unspecified: Secondary | ICD-10-CM | POA: Diagnosis not present

## 2020-09-10 DIAGNOSIS — J449 Chronic obstructive pulmonary disease, unspecified: Secondary | ICD-10-CM | POA: Diagnosis not present

## 2020-09-11 DIAGNOSIS — I959 Hypotension, unspecified: Secondary | ICD-10-CM | POA: Diagnosis not present

## 2020-09-11 DIAGNOSIS — I509 Heart failure, unspecified: Secondary | ICD-10-CM | POA: Diagnosis not present

## 2020-09-11 DIAGNOSIS — I5033 Acute on chronic diastolic (congestive) heart failure: Secondary | ICD-10-CM | POA: Diagnosis not present

## 2020-09-11 DIAGNOSIS — E1122 Type 2 diabetes mellitus with diabetic chronic kidney disease: Secondary | ICD-10-CM | POA: Diagnosis not present

## 2020-09-11 DIAGNOSIS — R0602 Shortness of breath: Secondary | ICD-10-CM | POA: Diagnosis not present

## 2020-09-11 DIAGNOSIS — I130 Hypertensive heart and chronic kidney disease with heart failure and stage 1 through stage 4 chronic kidney disease, or unspecified chronic kidney disease: Secondary | ICD-10-CM | POA: Diagnosis not present

## 2020-09-11 DIAGNOSIS — N183 Chronic kidney disease, stage 3 unspecified: Secondary | ICD-10-CM | POA: Diagnosis not present

## 2020-09-11 DIAGNOSIS — I4510 Unspecified right bundle-branch block: Secondary | ICD-10-CM | POA: Diagnosis not present

## 2020-09-11 DIAGNOSIS — Z743 Need for continuous supervision: Secondary | ICD-10-CM | POA: Diagnosis not present

## 2020-09-11 DIAGNOSIS — R609 Edema, unspecified: Secondary | ICD-10-CM | POA: Diagnosis not present

## 2020-09-11 DIAGNOSIS — R0989 Other specified symptoms and signs involving the circulatory and respiratory systems: Secondary | ICD-10-CM | POA: Diagnosis not present

## 2020-09-11 DIAGNOSIS — Z20822 Contact with and (suspected) exposure to covid-19: Secondary | ICD-10-CM | POA: Diagnosis not present

## 2020-09-11 DIAGNOSIS — I4891 Unspecified atrial fibrillation: Secondary | ICD-10-CM | POA: Diagnosis not present

## 2020-09-11 DIAGNOSIS — J811 Chronic pulmonary edema: Secondary | ICD-10-CM | POA: Diagnosis not present

## 2020-09-12 DIAGNOSIS — I4821 Permanent atrial fibrillation: Secondary | ICD-10-CM | POA: Diagnosis not present

## 2020-09-12 DIAGNOSIS — J449 Chronic obstructive pulmonary disease, unspecified: Secondary | ICD-10-CM | POA: Diagnosis not present

## 2020-09-12 DIAGNOSIS — N1832 Chronic kidney disease, stage 3b: Secondary | ICD-10-CM | POA: Diagnosis not present

## 2020-09-12 DIAGNOSIS — I5031 Acute diastolic (congestive) heart failure: Secondary | ICD-10-CM | POA: Diagnosis not present

## 2020-09-14 DIAGNOSIS — I5031 Acute diastolic (congestive) heart failure: Secondary | ICD-10-CM | POA: Diagnosis not present

## 2020-09-14 DIAGNOSIS — N1832 Chronic kidney disease, stage 3b: Secondary | ICD-10-CM | POA: Diagnosis not present

## 2020-09-14 DIAGNOSIS — J449 Chronic obstructive pulmonary disease, unspecified: Secondary | ICD-10-CM | POA: Diagnosis not present

## 2020-09-14 DIAGNOSIS — I4821 Permanent atrial fibrillation: Secondary | ICD-10-CM | POA: Diagnosis not present

## 2020-09-18 DIAGNOSIS — E875 Hyperkalemia: Secondary | ICD-10-CM | POA: Diagnosis not present

## 2020-09-18 DIAGNOSIS — I10 Essential (primary) hypertension: Secondary | ICD-10-CM | POA: Diagnosis not present

## 2020-09-18 DIAGNOSIS — N1832 Chronic kidney disease, stage 3b: Secondary | ICD-10-CM | POA: Diagnosis not present

## 2020-09-21 DIAGNOSIS — N1832 Chronic kidney disease, stage 3b: Secondary | ICD-10-CM | POA: Diagnosis not present

## 2020-09-21 DIAGNOSIS — I10 Essential (primary) hypertension: Secondary | ICD-10-CM | POA: Diagnosis not present

## 2020-10-02 DIAGNOSIS — E7849 Other hyperlipidemia: Secondary | ICD-10-CM | POA: Diagnosis not present

## 2020-10-02 DIAGNOSIS — I10 Essential (primary) hypertension: Secondary | ICD-10-CM | POA: Diagnosis not present

## 2020-10-02 DIAGNOSIS — N1832 Chronic kidney disease, stage 3b: Secondary | ICD-10-CM | POA: Diagnosis not present

## 2020-10-02 DIAGNOSIS — E782 Mixed hyperlipidemia: Secondary | ICD-10-CM | POA: Diagnosis not present

## 2020-10-02 DIAGNOSIS — J449 Chronic obstructive pulmonary disease, unspecified: Secondary | ICD-10-CM | POA: Diagnosis not present

## 2020-10-03 DIAGNOSIS — I480 Paroxysmal atrial fibrillation: Secondary | ICD-10-CM | POA: Diagnosis not present

## 2020-10-03 DIAGNOSIS — I5031 Acute diastolic (congestive) heart failure: Secondary | ICD-10-CM | POA: Diagnosis not present

## 2020-10-03 DIAGNOSIS — R000 Tachycardia, unspecified: Secondary | ICD-10-CM | POA: Diagnosis not present

## 2020-10-04 DIAGNOSIS — I10 Essential (primary) hypertension: Secondary | ICD-10-CM | POA: Diagnosis not present

## 2020-10-04 DIAGNOSIS — D472 Monoclonal gammopathy: Secondary | ICD-10-CM | POA: Diagnosis not present

## 2020-10-04 DIAGNOSIS — Z23 Encounter for immunization: Secondary | ICD-10-CM | POA: Diagnosis not present

## 2020-10-04 DIAGNOSIS — I4891 Unspecified atrial fibrillation: Secondary | ICD-10-CM | POA: Diagnosis not present

## 2020-10-04 DIAGNOSIS — I5022 Chronic systolic (congestive) heart failure: Secondary | ICD-10-CM | POA: Diagnosis not present

## 2020-10-04 DIAGNOSIS — E7849 Other hyperlipidemia: Secondary | ICD-10-CM | POA: Diagnosis not present

## 2020-10-04 DIAGNOSIS — M25532 Pain in left wrist: Secondary | ICD-10-CM | POA: Diagnosis not present

## 2020-10-22 DIAGNOSIS — I509 Heart failure, unspecified: Secondary | ICD-10-CM | POA: Diagnosis not present

## 2020-10-22 DIAGNOSIS — E875 Hyperkalemia: Secondary | ICD-10-CM | POA: Diagnosis not present

## 2020-10-22 DIAGNOSIS — N1832 Chronic kidney disease, stage 3b: Secondary | ICD-10-CM | POA: Diagnosis not present

## 2020-10-22 DIAGNOSIS — I10 Essential (primary) hypertension: Secondary | ICD-10-CM | POA: Diagnosis not present

## 2020-10-22 DIAGNOSIS — R5383 Other fatigue: Secondary | ICD-10-CM | POA: Diagnosis not present

## 2020-10-31 ENCOUNTER — Encounter: Payer: Self-pay | Admitting: *Deleted

## 2020-10-31 DIAGNOSIS — E039 Hypothyroidism, unspecified: Secondary | ICD-10-CM | POA: Diagnosis not present

## 2020-10-31 DIAGNOSIS — E1165 Type 2 diabetes mellitus with hyperglycemia: Secondary | ICD-10-CM | POA: Diagnosis not present

## 2020-10-31 DIAGNOSIS — I5022 Chronic systolic (congestive) heart failure: Secondary | ICD-10-CM | POA: Diagnosis not present

## 2020-10-31 DIAGNOSIS — I4821 Permanent atrial fibrillation: Secondary | ICD-10-CM | POA: Diagnosis not present

## 2020-11-01 ENCOUNTER — Encounter: Payer: Self-pay | Admitting: Cardiology

## 2020-11-01 ENCOUNTER — Encounter: Payer: Self-pay | Admitting: *Deleted

## 2020-11-01 ENCOUNTER — Ambulatory Visit (INDEPENDENT_AMBULATORY_CARE_PROVIDER_SITE_OTHER): Payer: Medicare Other | Admitting: Cardiology

## 2020-11-01 ENCOUNTER — Other Ambulatory Visit: Payer: Self-pay

## 2020-11-01 VITALS — BP 114/70 | HR 60 | Ht 77.0 in | Wt 224.0 lb

## 2020-11-01 DIAGNOSIS — R0602 Shortness of breath: Secondary | ICD-10-CM | POA: Diagnosis not present

## 2020-11-01 DIAGNOSIS — I4819 Other persistent atrial fibrillation: Secondary | ICD-10-CM

## 2020-11-01 DIAGNOSIS — I509 Heart failure, unspecified: Secondary | ICD-10-CM
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
## 2020-11-01 NOTE — Patient Instructions (Addendum)
Medication Instructions:  Continue all current medications.  Labwork: none  Testing/Procedures: Your physician has requested that you have an echocardiogram. Echocardiography is a painless test that uses sound waves to create images of your heart. It provides your doctor with information about the size and shape of your heart and how well your heart's chambers and valves are working. This procedure takes approximately one hour. There are no restrictions for this procedure.  Office will contact with results via phone or letter.     Follow-Up: 3- 4 weeks    Any Other Special Instructions Will Be Listed Below (If Applicable).   If you need a refill on your cardiac medications before your next appointment, please call your pharmacy.

## 2020-11-02 DIAGNOSIS — I455 Other specified heart block: Secondary | ICD-10-CM | POA: Diagnosis not present

## 2020-11-02 DIAGNOSIS — R944 Abnormal results of kidney function studies: Secondary | ICD-10-CM | POA: Diagnosis not present

## 2020-11-02 DIAGNOSIS — R6889 Other general symptoms and signs: Secondary | ICD-10-CM | POA: Diagnosis not present

## 2020-11-02 DIAGNOSIS — R0902 Hypoxemia: Secondary | ICD-10-CM | POA: Diagnosis not present

## 2020-11-02 DIAGNOSIS — E039 Hypothyroidism, unspecified: Secondary | ICD-10-CM | POA: Diagnosis not present

## 2020-11-02 DIAGNOSIS — Z743 Need for continuous supervision: Secondary | ICD-10-CM | POA: Diagnosis not present

## 2020-11-02 DIAGNOSIS — R092 Respiratory arrest: Secondary | ICD-10-CM | POA: Diagnosis not present

## 2020-11-02 DIAGNOSIS — R404 Transient alteration of awareness: Secondary | ICD-10-CM | POA: Diagnosis not present

## 2020-11-06 ENCOUNTER — Ambulatory Visit (HOSPITAL_COMMUNITY): Admission: RE | Admit: 2020-11-06 | Payer: Medicare Other | Source: Ambulatory Visit
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
## 2020-11-15 ENCOUNTER — Ambulatory Visit: Payer: Medicare Other | Admitting: Family Medicine

## 2020-11-15 DIAGNOSIS — N183 Chronic kidney disease, stage 3 unspecified: Secondary | ICD-10-CM

## 2020-11-15 DIAGNOSIS — I4819 Other persistent atrial fibrillation: Secondary | ICD-10-CM

## 2020-11-15 DIAGNOSIS — I509 Heart failure, unspecified: Secondary | ICD-10-CM

## 2020-11-15 DIAGNOSIS — R0602 Shortness of breath: Secondary | ICD-10-CM

## 2020-12-24 ENCOUNTER — Ambulatory Visit (HOSPITAL_COMMUNITY): Admission: RE | Admit: 2020-12-24 | Payer: Medicare Other | Source: Ambulatory Visit
